# Patient Record
Sex: Female | Born: 1950 | Race: White | Hispanic: No | Marital: Married | Smoking: Current some day smoker
Health system: Southern US, Community
[De-identification: ages and names within clinical notes are randomized; demographics above are authoritative.]

## PROBLEM LIST (undated history)

## (undated) ENCOUNTER — Encounter: Attending: Gastroenterology | Primary: Gastroenterology

## (undated) ENCOUNTER — Telehealth: Attending: Gastroenterology | Primary: Gastroenterology

## (undated) ENCOUNTER — Encounter: Payer: MEDICARE | Attending: Gastroenterology | Primary: Gastroenterology

## (undated) ENCOUNTER — Ambulatory Visit

## (undated) ENCOUNTER — Institutional Professional Consult (permissible substitution): Payer: MEDICARE | Attending: Gastroenterology | Primary: Gastroenterology

## (undated) ENCOUNTER — Telehealth

## (undated) ENCOUNTER — Encounter

## (undated) ENCOUNTER — Ambulatory Visit: Payer: MEDICARE | Attending: Gastroenterology | Primary: Gastroenterology

## (undated) DIAGNOSIS — K5090 Crohn's disease, unspecified, without complications: Secondary | ICD-10-CM

## (undated) DIAGNOSIS — I10 Essential (primary) hypertension: Secondary | ICD-10-CM

## (undated) DIAGNOSIS — E739 Lactose intolerance, unspecified: Secondary | ICD-10-CM

## (undated) DIAGNOSIS — Z87898 Personal history of other specified conditions: Secondary | ICD-10-CM

## (undated) DIAGNOSIS — E039 Hypothyroidism, unspecified: Secondary | ICD-10-CM

## (undated) DIAGNOSIS — J45909 Unspecified asthma, uncomplicated: Secondary | ICD-10-CM

## (undated) HISTORY — PX: TRIGGER FINGER RELEASE: SHX641

## (undated) HISTORY — PX: COLONOSCOPY: SHX5424

## (undated) HISTORY — PX: BUNIONECTOMY: SHX129

## (undated) HISTORY — PX: COLON SURGERY: SHX602

---
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
## 2004-04-19 ENCOUNTER — Ambulatory Visit: Payer: Self-pay | Admitting: Gastroenterology

## 2004-04-27 ENCOUNTER — Ambulatory Visit: Payer: Self-pay | Admitting: Gastroenterology

## 2005-02-07 ENCOUNTER — Ambulatory Visit: Payer: Self-pay | Admitting: Internal Medicine

## 2006-01-03 ENCOUNTER — Ambulatory Visit: Payer: Self-pay | Admitting: Internal Medicine

## 2007-10-16 ENCOUNTER — Ambulatory Visit: Payer: Self-pay | Admitting: Internal Medicine

## 2008-12-02 ENCOUNTER — Ambulatory Visit: Payer: Self-pay | Admitting: Internal Medicine

## 2010-04-27 ENCOUNTER — Ambulatory Visit: Payer: Self-pay | Admitting: Internal Medicine

## 2012-01-03 ENCOUNTER — Ambulatory Visit: Payer: Self-pay | Admitting: Family Medicine

## 2013-04-02 ENCOUNTER — Ambulatory Visit: Payer: Self-pay | Admitting: Family Medicine

## 2014-05-08 ENCOUNTER — Ambulatory Visit: Payer: Self-pay | Admitting: Podiatry

## 2014-06-02 ENCOUNTER — Ambulatory Visit: Payer: Self-pay | Admitting: Family Medicine

## 2015-06-22 ENCOUNTER — Other Ambulatory Visit: Payer: Self-pay | Admitting: Family Medicine

## 2015-06-22 DIAGNOSIS — Z1231 Encounter for screening mammogram for malignant neoplasm of breast: Secondary | ICD-10-CM

## 2015-06-30 ENCOUNTER — Ambulatory Visit
Admission: RE | Admit: 2015-06-30 | Discharge: 2015-06-30 | Disposition: A | Discharge status: Home / Self Care | Payer: Medicare HMO | Source: Ambulatory Visit | Attending: Family Medicine | Admitting: Family Medicine

## 2015-06-30 ENCOUNTER — Other Ambulatory Visit: Payer: Self-pay | Admitting: Family Medicine

## 2015-06-30 ENCOUNTER — Ambulatory Visit: Payer: Self-pay

## 2015-06-30 DIAGNOSIS — Z1231 Encounter for screening mammogram for malignant neoplasm of breast: Secondary | ICD-10-CM | POA: Diagnosis present

## 2015-07-01 ENCOUNTER — Other Ambulatory Visit: Payer: Self-pay | Admitting: Family Medicine

## 2015-07-01 DIAGNOSIS — R928 Other abnormal and inconclusive findings on diagnostic imaging of breast: Secondary | ICD-10-CM

## 2015-07-07 ENCOUNTER — Ambulatory Visit
Admission: RE | Admit: 2015-07-07 | Discharge: 2015-07-07 | Disposition: A | Discharge status: Home / Self Care | Payer: Medicare HMO | Source: Ambulatory Visit | Attending: Family Medicine | Admitting: Family Medicine

## 2015-07-07 DIAGNOSIS — R928 Other abnormal and inconclusive findings on diagnostic imaging of breast: Secondary | ICD-10-CM

## 2016-08-15 ENCOUNTER — Other Ambulatory Visit: Payer: Self-pay | Admitting: Family Medicine

## 2016-08-15 DIAGNOSIS — Z1231 Encounter for screening mammogram for malignant neoplasm of breast: Secondary | ICD-10-CM

## 2016-09-12 ENCOUNTER — Ambulatory Visit
Admission: RE | Admit: 2016-09-12 | Discharge: 2016-09-12 | Disposition: A | Discharge status: Home / Self Care | Payer: Medicare HMO | Source: Ambulatory Visit | Attending: Family Medicine | Admitting: Family Medicine

## 2016-09-12 DIAGNOSIS — Z1231 Encounter for screening mammogram for malignant neoplasm of breast: Secondary | ICD-10-CM | POA: Diagnosis present

## 2016-11-23 MED ORDER — ADALIMUMAB 40 MG/0.8 ML SUBCUTANEOUS PEN KIT
SUBCUTANEOUS | 11 refills | 0 days
Start: 2016-11-23 — End: 2017-08-03

## 2017-01-12 ENCOUNTER — Ambulatory Visit
Admission: RE | Admit: 2017-01-12 | Discharge: 2017-01-12 | Disposition: A | Discharge status: Home / Self Care | Payer: MEDICARE | Attending: Gastroenterology | Admitting: Gastroenterology

## 2017-01-12 DIAGNOSIS — K50018 Crohn's disease of small intestine with other complication: Principal | ICD-10-CM

## 2017-01-12 DIAGNOSIS — D899 Disorder involving the immune mechanism, unspecified: Secondary | ICD-10-CM

## 2017-01-12 MED ORDER — DIPHENOXYLATE-ATROPINE 2.5 MG-0.025 MG TABLET
ORAL_TABLET | Freq: Four times a day (QID) | ORAL | 6 refills | 0 days | Status: CP | PRN
Start: 2017-01-12 — End: 2017-02-11

## 2017-01-31 ENCOUNTER — Ambulatory Visit
Admission: RE | Admit: 2017-01-31 | Discharge: 2017-01-31 | Disposition: A | Discharge status: Home / Self Care | Payer: MEDICARE

## 2017-01-31 DIAGNOSIS — D899 Disorder involving the immune mechanism, unspecified: Secondary | ICD-10-CM

## 2017-01-31 DIAGNOSIS — K50018 Crohn's disease of small intestine with other complication: Principal | ICD-10-CM

## 2017-02-01 MED ORDER — USTEKINUMAB 90 MG/ML SUBCUTANEOUS SYRINGE
SUBCUTANEOUS | 5 refills | 0.00000 days | Status: CP
Start: 2017-02-01 — End: 2017-03-20

## 2017-02-01 MED ORDER — USTEKINUMAB 90 MG/ML SUBCUTANEOUS SYRINGE: mL | 5 refills | 0 days

## 2017-02-03 NOTE — Unmapped (Signed)
Patient will have 200$ copay if stelara given under medical benefit. She will have over 2000$ cost if SQ doses are obtained through pharmacy benefit.  She will apply to [facility] and [facility] but will likely receive denial.  Will need to discuss with Dr. Gwenith Spitz, financial counselor and patient.

## 2017-02-06 NOTE — Unmapped (Signed)
LM for patient to discuss timing of follow up visit in the next 1-2 weeks post results of CT scan and likely need to adjust current therapy.

## 2017-02-08 NOTE — Unmapped (Signed)
CT scan results reviewed with patient showing disease activity despite weekly humira. She is not currently approved for [facility] charity care, needs to apply.  May be able to apply to grant programs.  Will need to discuss cost with patient in more detail along with medication options.  Appt planned for 12/10 at 1pm with Dr. Gwenith Spitz.  Patient verbalized understanding and in agreement with plan.

## 2017-02-08 NOTE — Unmapped (Signed)
Patient will come 12/10 at 1pm to see Dr. Gwenith Spitz to discuss CT results and medication options from here.  Reviewed that most of our medications that we will be discussing will be about 200$ per dose.  Suggest applying for charity care, applying for grant money from foundations, setting up payment plan.  Will discuss in more detail with patient when she is here next week for follow up.  Patient verbalized understanding and in agreement with plan.

## 2017-03-20 ENCOUNTER — Encounter: Admit: 2017-03-20 | Discharge: 2017-03-21 | Payer: MEDICARE | Attending: Gastroenterology | Primary: Gastroenterology

## 2017-03-20 DIAGNOSIS — Z79899 Other long term (current) drug therapy: Secondary | ICD-10-CM

## 2017-03-20 DIAGNOSIS — K50018 Crohn's disease of small intestine with other complication: Principal | ICD-10-CM

## 2017-03-20 MED ORDER — BUDESONIDE DR - ER 3 MG CAPSULE,DELAYED,EXTENDED RELEASE
ORAL_CAPSULE | Freq: Every morning | ORAL | 0 refills | 0 days | Status: CP
Start: 2017-03-20 — End: 2017-04-19

## 2017-03-20 MED ORDER — PREDNISONE 10 MG TABLET
ORAL_TABLET | 0 refills | 0 days | Status: CP
Start: 2017-03-20 — End: 2017-10-12

## 2017-03-20 NOTE — Unmapped (Signed)
Gastroenterology Return Visit Note      HISTORY OF PRESENT ILLNESS: This is a 67 y.o. year old female with a history of Crohn's disease as outlined below. She continues to lose weight ( another pound since I last saw her). She has about 3-4 loose bowel movements daily, no blood mixed in the bowel movements. She takes Imodium to reduce frequency. However she can do it only for 1 day on the next day she has otherwise headaches if she would take it again. No abdominal pain. No fever, no extraintestinal manifestations of inflammatory bowel disease. Her recent CT shows several short inflammatory small bowel segments. Her adalimumab level was high on weekly dosing, however, she did not rebnew her free Humira and is currently 2 weeks out of the last injection. Application to charity care pending.      PAST MEDICAL HISTORY:    Past Medical History:   Diagnosis Date   ??? Crohn's disease (CMS-HCC)    ??? Disease of thyroid gland    ??? Hypertension        PAST SURGICAL HISTORY:    Past Surgical History:   Procedure Laterality Date   ??? ABDOMINAL SURGERY      colon resection   ??? BUNIONECTOMY Left     with pins   ??? COLON SURGERY      resection   ??? FOOT SURGERY Left     bunionectomy; pins placed   ??? PR COLONOSCOPY FLX DX W/COLLJ SPEC WHEN PFRMD N/A 08/03/2012    Procedure: COLONOSCOPY, FLEXIBLE, PROXIMAL TO SPLENIC FLEXURE; DIAGNOSTIC, W/WO COLLECTION SPECIMEN BY BRUSH OR WASH;  Surgeon: Trula Slade, MD;  Location: [facility];  Service: Gastroenterology   ??? PR COLONOSCOPY FLX DX W/COLLJ SPEC WHEN PFRMD N/A 10/24/2014    Procedure: COLONOSCOPY, FLEXIBLE, PROXIMAL TO SPLENIC FLEXURE; DIAGNOSTIC, W/WO COLLECTION SPECIMEN BY BRUSH OR WASH;  Surgeon: Trula Slade, MD;  Location: [facility];  Service: Gastroenterology   ??? PR COLONOSCOPY FLX DX W/COLLJ SPEC WHEN PFRMD N/A 04/08/2016    Procedure: COLONOSCOPY, FLEXIBLE, PROXIMAL TO SPLENIC FLEXURE; DIAGNOSTIC, W/WO COLLECTION SPECIMEN BY BRUSH OR WASH; Surgeon: Modena Nunnery, MD;  Location: [facility];  Service: Gastroenterology         CURRENT MEDICATIONS:      Current Outpatient Prescriptions:   ???  biotin 1 mg tablet, Take 2,000 mcg by mouth daily. , Disp: , Rfl:   ???  calcium-vitamin D (CALCIUM-VITAMIN D) 500 mg(1,250mg ) -200 unit per tablet, Take 1 tablet by mouth daily. , Disp: , Rfl:   ???  levothyroxine (SYNTHROID) 25 MCG tablet, Take 25 mcg by mouth daily at 0600. , Disp: , Rfl:   ???  metoprolol succinate (TOPROL-XL) 50 MG 24 hr tablet, Take 50 mg by mouth daily., Disp: , Rfl:   ???  multivitamin capsule, Take 1 capsule by mouth daily., Disp: , Rfl:   ???  multivitamin,tx-minerals Tab, Take 1 tablet by mouth daily with breakfast. , Disp: , Rfl:   ???  adalimumab (HUMIRA) 40 mg/0.8 mL subcutaneous pen kit, Inject 0.8 mL (40 mg total) under the skin once a week. (Patient not taking: Reported on 03/20/2017), Disp: 4 each, Rfl: 11  ???  budesonide (ENTOCORT EC) 3 mg 24 hr capsule, Take 3 capsules (9 mg total) by mouth every morning., Disp: 90 capsule, Rfl: 0  ???  cyanocobalamin (B-12 DOTS) 500 MCG tablet, Take by mouth., Disp: , Rfl:   ???  predniSONE (DELTASONE) 10  MG tablet, Prednisone 40 mg for 2 weeks, then  30 mg, 20 mg, 15 mg, 10 mg, 5 mg each for 1 week, Disp: 180 tablet, Rfl: 0       VITAL SIGNS:  BP Readings from Last 4 Encounters:   03/20/17 140/60   01/12/17 145/65   04/08/16 124/70   02/01/16 130/73      Wt Readings from Last 4 Encounters:   03/20/17 50.4 kg (111 lb 1.8 oz)   01/12/17 50.8 kg (112 lb)   04/08/16 51.7 kg (114 lb)   02/01/16 52.8 kg (116 lb 6.4 oz)        BMI: Estimated body mass index is 18.49 kg/m?? as calculated from the following:    Height as of 04/08/16: 165.1 cm (5' 5).    Weight as of this encounter: 50.4 kg (111 lb 1.8 oz).    BSA: Estimated body surface area is 1.52 meters squared as calculated from the following:    Height as of 04/08/16: 165.1 cm (5' 5).    Weight as of this encounter: 50.4 kg (111 lb 1.8 oz). PHYSICAL EXAM:    Constitutional:   Alert, oriented x 3, no acute distress, well nourished, and well hydrated.   Mental Status:   Thought organized, appropriate affect, pleasantly interactive, not anxious appearing.   HEENT:   PEERL, conjunctiva clear, anicteric, oropharynx clear, neck supple, no LAD.   Respiratory: Clear to auscultation, unlabored breathing.     Cardiac: Euvolemic, regular rate and rhythm, normal S1 and S2, no murmur.     Abdomen: Soft, normal bowel sounds, non-distended, non-tender, no organomegaly or masses.     Perianal/Rectal Exam Not performed.     Extremities:   No edema, well perfused.   Musculoskeletal: No joint swelling or tenderness noted, no deformities.     Skin: No rashes, jaundice or skin lesions noted.     Neuro: No focal deficits.          ASSESSMENT:    1. Crohns disease with Hx of small bowel resection due to stricturing and fistulizing luminal disease in 2008 and recurrence at anastomosis in 02/2010. Start of Humira in 04/2010       Course of Disease:  First diagnosis of Crohn's disease in approximately the late 1980s. At that time, she had cramping abdominal pain and diarrhea. She was started on a steroid taper and maintained on sulfasalazine. Remission until approximately 2004. In 2004 start of abdominal bloating, epigastric pain, and intermittent loose stools. 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 Start of ciprofloxacin therapy approximately in 2005 as well as Entocort and of Pentasa 4 g daily. Approximately in the beginning of 2007 decrease of entocort to 6 mg per day, however, still abdominal pain, bloating. 05/08/06 Ileocecal resection with ileostomy and repair of ileal fistula as well as takedown and repair of ileal sigmoid fistula with sigmoid colectomy (About 18 inches of small bowel and 4 inches of cecum/colon were resected). 06/22/06,ileostomy takedown with ileal colonic anastomosis. 11/2006 remission. Intolerance of 6-MP therapy (joint pain, hair loss, nausea).12/08Start of sulfasalazine due to joint pain. 5/09-10/2009 remission. 04/2010, start of Humira. 09/2010 -02/2012 clinical remission. 03/2014 mild symptoms in the settings of missing a dose of Humira 07/2014 remission but another interruption of Humira therapy for 4 weeks due to foot surgery 01/2015 increase to weekly Humira therapy in the setting of I3 recurrence 01/2017 continues weight loss on weekly Humira therapy 02/2017 CT showing several inflammatory  small bowel segments 03/2017 start of prednisone taper       Montreal Classification: [id number]     Crohn's disease: The optimal approach would be probably in the moment budesonide and adalimumab, but patient can most likely not affortd budesonide , thus we plan a steroid taper. Long term probably a switch to another biologic is warranted due to intolerance of 6-MP in the past. Optimal would be ustekinumab, but if patient is not approved by charity care show would have approx. $100 copayment's/ month which she cant afford. MTX would be another possibility, but I am not sure if the patient will tolerate the potential side effects.    We will continue with imodium 1 tabl q 2 days (due to headache), lomotil was not approved.     Health maintenance: Prevnar 2015 and 2016. Pneumovax 2016. Flue shot 2018    PLAN:  Patient Instructions   Prednisone 40 mg for 2 weeks, then  30 mg, 20 mg, 15 mg, 10 mg, 5 mg each for 1 week       Ask what 1 month of budesonide (entocort) would cost you.    Renewal of Humira prescription q 2 weeks.    Best solution would be to start Stelara with charity care. If not possible second best choice would be maintenance therapy with budesonide 6 mg daily and Humira q 2 weeks.             DIAGNOSTIC STUDIES:  I have reviewed all pertinent diagnostic studies, including:    Colonoscopy 04/2016: There was evidence of a prior end-to-side ileo-colonic anastomosis in        the ascending colon. This was patent and was characterized by edema,        erythema and ulceration. The anastomosis was traversed. Rutgeerts i2-i3.       The terminal ileum appeared normal. should perform another                        colonoscopy for assessment of inflammatuion in 12-18                        months.  Laboratory results    All Labs    Lab Results   Component Value Date    AADAL <25 10/27/2014     Lab Results   Component Value Date    ADALIMUMAB 31.7 01/12/2017    ADALIMUMAB 5.1 10/27/2014       No visits with results within 1 Week(s) from this visit.   Latest known visit with results is:   Office Visit on 01/12/2017   Component Date Value Ref Range Status   ??? Adalimumab 01/12/2017 31.7  mcg/mL Final   ??? Alkaline Phosphatase 01/12/2017 99  38 - 126 U/L Final   ??? ALT 01/12/2017 27  15 - 48 U/L Final   ??? AST 01/12/2017 26  14 - 38 U/L Final   ??? Sodium 01/12/2017 141  135 - 145 mmol/L Final   ??? Potassium 01/12/2017 4.2  3.5 - 5.0 mmol/L Final   ??? Chloride 01/12/2017 104  98 - 107 mmol/L Final   ??? CO2 01/12/2017 30.0  22.0 - 30.0 mmol/L Final   ??? BUN 01/12/2017 12  7 - 21 mg/dL Final   ??? Creatinine 01/12/2017 0.67  0.60 - 1.00 mg/dL Final   ??? BUN/Creatinine Ratio 01/12/2017 18   Final   ??? EGFR MDRD Non Af Amer 01/12/2017 >=60  >=  60 mL/min/1.47m2 Final   ??? EGFR MDRD Af Amer 01/12/2017 >=60  >=60 mL/min/1.55m2 Final   ??? Anion Gap 01/12/2017 7* 9 - 15 mmol/L Final   ??? Glucose 01/12/2017 80  65 - 179 mg/dL Final   ??? Calcium 16/12/9602 10.1  8.5 - 10.2 mg/dL Final   ??? Vitamin D Total (25OH) 01/12/2017 44.9  20.0 - 80.0 ng/mL Final   ??? Ferritin 01/12/2017 69.9  3.0 - 151.0 ng/mL Final   ??? Vitamin B-12 01/12/2017 246  193 - 900 pg/ml Final   ??? Folate 01/12/2017 >20.0* 2.7 - 20.0 ng/mL Final   ??? PT 01/12/2017 11.3  10.2 - 12.8 sec Final   ??? INR 01/12/2017 0.99   Final   ??? WBC 01/12/2017 9.8  4.5 - 11.0 10*9/L Final   ??? RBC 01/12/2017 4.14  4.00 - 5.20 10*12/L Final   ??? HGB 01/12/2017 12.8  12.0 - 16.0 g/dL Final   ??? HCT 54/11/8117 39.1  36.0 - 46.0 % Final   ??? MCV 01/12/2017 94.3  80.0 - 100.0 fL Final   ??? MCH 01/12/2017 30.8  26.0 - 34.0 pg Final   ??? MCHC 01/12/2017 32.7  31.0 - 37.0 g/dL Final   ??? RDW 14/78/2956 13.3  12.0 - 15.0 % Final   ??? MPV 01/12/2017 7.8  7.0 - 10.0 fL Final   ??? Platelet 01/12/2017 251  150 - 440 10*9/L Final   ??? Absolute Neutrophils 01/12/2017 5.8  2.0 - 7.5 10*9/L Final   ??? Absolute Lymphocytes 01/12/2017 2.8  1.5 - 5.0 10*9/L Final   ??? Absolute Monocytes 01/12/2017 0.7  0.2 - 0.8 10*9/L Final   ??? Absolute Eosinophils 01/12/2017 0.2  0.0 - 0.4 10*9/L Final   ??? Absolute Basophils 01/12/2017 0.1  0.0 - 0.1 10*9/L Final   ??? Large Unstained Cells 01/12/2017 2  0 - 4 % Final

## 2017-03-20 NOTE — Unmapped (Signed)
Prednisone 40 mg for 2 weeks, then  30 mg, 20 mg, 15 mg, 10 mg, 5 mg each for 1 week       Ask what 1 month of budesonide (entocort) would cost you.    Renewal of Humira prescription q 2 weeks.    Best solution would be to start Stelara with charity care. If not possible second best choice would be maintenance therapy with budesonide 6 mg daily and Humira q 2 weeks.

## 2017-04-04 NOTE — Unmapped (Signed)
Application for humira PAP renewal sent 03/20/2017, patient given her portion to send separately.  Called to check on status today and was notified that patient was approved as of 04/03/2017. Inclement weather in [place] has limited ship dates and patient instructed to call to order asap to avoid further delay.  She will being with loading dose at 160mg  day 1, then 80mg  day 15 followed by 40mg  every 2weeks maintenance.  She will contact IBD RN as soon as she hears back from [facility] charity care in terms of approval for that program at which time plan will be to switch to stelara under medical benefit.  Patient verbalized understanding and in agreement with plan.

## 2017-07-06 ENCOUNTER — Encounter: Admit: 2017-07-06 | Discharge: 2017-07-07 | Payer: MEDICARE | Attending: Gastroenterology | Primary: Gastroenterology

## 2017-07-06 DIAGNOSIS — Z79899 Other long term (current) drug therapy: Secondary | ICD-10-CM

## 2017-07-06 DIAGNOSIS — K50018 Crohn's disease of small intestine with other complication: Principal | ICD-10-CM

## 2017-07-06 LAB — FERRITIN: Ferritin:MCnc:Pt:Ser/Plas:Qn:: 39.1

## 2017-07-06 LAB — BASIC METABOLIC PANEL
BLOOD UREA NITROGEN: 14 mg/dL (ref 7–21)
BUN / CREAT RATIO: 23
CALCIUM: 9.7 mg/dL (ref 8.5–10.2)
CHLORIDE: 111 mmol/L — ABNORMAL HIGH (ref 98–107)
CO2: 28 mmol/L (ref 22.0–30.0)
CREATININE: 0.6 mg/dL (ref 0.60–1.00)
EGFR MDRD AF AMER: >=60 mL/min/{1.73_m2} (ref >=60)
EGFR MDRD NON AF AMER: >=60 mL/min/{1.73_m2} (ref >=60)
GLUCOSE RANDOM: 78 mg/dL (ref 65–179)
POTASSIUM: 4.6 mmol/L (ref 3.5–5.0)

## 2017-07-06 LAB — CBC W/ AUTO DIFF
BASOPHILS ABSOLUTE COUNT: 0.1 10*9/L (ref 0.0–0.1)
BASOPHILS RELATIVE PERCENT: 0.7 %
EOSINOPHILS ABSOLUTE COUNT: 0.2 10*9/L (ref 0.0–0.4)
EOSINOPHILS RELATIVE PERCENT: 2.7 %
HEMATOCRIT: 37.4 % (ref 36.0–46.0)
LARGE UNSTAINED CELLS: 3 % (ref 0–4)
LYMPHOCYTES ABSOLUTE COUNT: 2.5 10*9/L (ref 1.5–5.0)
MEAN CORPUSCULAR HEMOGLOBIN CONC: 31.9 g/dL (ref 31.0–37.0)
MEAN CORPUSCULAR HEMOGLOBIN: 30.9 pg (ref 26.0–34.0)
MEAN CORPUSCULAR VOLUME: 97.1 fL (ref 80.0–100.0)
MEAN PLATELET VOLUME: 8.4 fL (ref 7.0–10.0)
MONOCYTES RELATIVE PERCENT: 7.8 %
NEUTROPHILS ABSOLUTE COUNT: 4.2 10*9/L (ref 2.0–7.5)
NEUTROPHILS RELATIVE PERCENT: 53.4 %
PLATELET COUNT: 223 10*9/L (ref 150–440)
RED BLOOD CELL COUNT: 3.85 10*12/L — ABNORMAL LOW (ref 4.00–5.20)
RED CELL DISTRIBUTION WIDTH: 13.5 % (ref 12.0–15.0)
WBC ADJUSTED: 7.8 10*9/L (ref 4.5–11.0)

## 2017-07-06 LAB — CO2: Carbon dioxide:SCnc:Pt:Ser/Plas:Qn:: 28

## 2017-07-06 LAB — ALKALINE PHOSPHATASE: Alkaline phosphatase:CCnc:Pt:Ser/Plas:Qn:: 81

## 2017-07-06 LAB — C-REACTIVE PROTEIN: C reactive protein:MCnc:Pt:Ser/Plas:Qn:: <5

## 2017-07-06 LAB — AST (SGOT): Aspartate aminotransferase:CCnc:Pt:Ser/Plas:Qn:: 25

## 2017-07-06 LAB — IRON PANEL
IRON SATURATION (CALC): 27 % (ref 15–50)
TOTAL IRON BINDING CAPACITY (CALC): 379.8 mg/dL (ref 252.0–479.0)

## 2017-07-06 LAB — WBC ADJUSTED: Lab: 7.8

## 2017-07-06 LAB — ALT (SGPT): Alanine aminotransferase:CCnc:Pt:Ser/Plas:Qn:: 16

## 2017-07-06 LAB — IRON SATURATION (CALC): Iron saturation:MFr:Pt:Ser/Plas:Qn:: 27

## 2017-07-06 NOTE — Unmapped (Addendum)
Think about the clinical trial, let me know if you have any questions. We can call you and explain details. If you begin to feel worse we really should consider this option.    Labs today    Continue Humira weekly.

## 2017-07-06 NOTE — Unmapped (Signed)
Gastroenterology Return Visit Note      HISTORY OF PRESENT ILLNESS: This is a 68 y.o. year old female with a history of Crohn's disease as outlined below. She gained her weight back after steroid steroid tapertaper in January. When on steroids she did very well, off steroids her problem partially returned with increase of bowel frequency up to 3-4 bowel movements daily and some intermittent abdominal pain. No fever.    PAST MEDICAL HISTORY:    Past Medical History:   Diagnosis Date   ??? Crohn's disease (CMS-HCC)    ??? Disease of thyroid gland    ??? Hypertension        PAST SURGICAL HISTORY:    Past Surgical History:   Procedure Laterality Date   ??? ABDOMINAL SURGERY      colon resection   ??? BUNIONECTOMY Left     with pins   ??? COLON SURGERY      resection   ??? FOOT SURGERY Left     bunionectomy; pins placed   ??? PR COLONOSCOPY FLX DX W/COLLJ SPEC WHEN PFRMD N/A 08/03/2012    Procedure: COLONOSCOPY, FLEXIBLE, PROXIMAL TO SPLENIC FLEXURE; DIAGNOSTIC, W/WO COLLECTION SPECIMEN BY BRUSH OR WASH;  Surgeon: Trula Slade, MD;  Location: [facility];  Service: Gastroenterology   ??? PR COLONOSCOPY FLX DX W/COLLJ SPEC WHEN PFRMD N/A 10/24/2014    Procedure: COLONOSCOPY, FLEXIBLE, PROXIMAL TO SPLENIC FLEXURE; DIAGNOSTIC, W/WO COLLECTION SPECIMEN BY BRUSH OR WASH;  Surgeon: Trula Slade, MD;  Location: [facility];  Service: Gastroenterology   ??? PR COLONOSCOPY FLX DX W/COLLJ SPEC WHEN PFRMD N/A 04/08/2016    Procedure: COLONOSCOPY, FLEXIBLE, PROXIMAL TO SPLENIC FLEXURE; DIAGNOSTIC, W/WO COLLECTION SPECIMEN BY BRUSH OR WASH;  Surgeon: Modena Nunnery, MD;  Location: [facility];  Service: Gastroenterology         CURRENT MEDICATIONS:      Current Outpatient Medications:   ???  adalimumab (HUMIRA) 40 mg/0.8 mL subcutaneous pen kit, Inject 0.8 mL (40 mg total) under the skin once a week., Disp: 4 each, Rfl: 11  ???  biotin 1 mg tablet, Take 1,000 mcg by mouth daily. , Disp: , Rfl: ???  calcium-vitamin D (CALCIUM-VITAMIN D) 500 mg(1,250mg ) -200 unit per tablet, Take 1 tablet by mouth daily. , Disp: , Rfl:   ???  levothyroxine (SYNTHROID) 25 MCG tablet, Take 25 mcg by mouth daily at 0600. , Disp: , Rfl:   ???  metoprolol succinate (TOPROL-XL) 50 MG 24 hr tablet, Take 50 mg by mouth daily., Disp: , Rfl:   ???  multivitamin,tx-minerals Tab, Take 1 tablet by mouth daily with breakfast. , Disp: , Rfl:   ???  cyanocobalamin (B-12 DOTS) 500 MCG tablet, Take by mouth., Disp: , Rfl:   ???  multivitamin capsule, Take 1 capsule by mouth daily., Disp: , Rfl:   ???  predniSONE (DELTASONE) 10 MG tablet, Prednisone 40 mg for 2 weeks, then  30 mg, 20 mg, 15 mg, 10 mg, 5 mg each for 1 week (Patient not taking: Reported on 07/06/2017), Disp: 180 tablet, Rfl: 0       VITAL SIGNS:  BP Readings from Last 4 Encounters:   07/06/17 145/66   03/20/17 140/60   01/12/17 145/65   04/08/16 124/70      Wt Readings from Last 4 Encounters:   07/06/17 52.4 kg (115 lb 8 oz)   03/20/17 50.4 kg (111 lb 1.8 oz)   01/12/17 50.8 kg (112 lb)  04/08/16 51.7 kg (114 lb)        BMI: Estimated body mass index is 19.22 kg/m?? as calculated from the following:    Height as of 04/08/16: 165.1 cm (5' 5).    Weight as of this encounter: 52.4 kg (115 lb 8 oz).    BSA: Estimated body surface area is 1.55 meters squared as calculated from the following:    Height as of 04/08/16: 165.1 cm (5' 5).    Weight as of this encounter: 52.4 kg (115 lb 8 oz).      PHYSICAL EXAM:    Constitutional:   Alert, oriented x 3, no acute distress, well nourished, and well hydrated.   Mental Status:   Thought organized, appropriate affect, pleasantly interactive, not anxious appearing.   HEENT:   PEERL, conjunctiva clear, anicteric, oropharynx clear, neck supple, no LAD.   Respiratory: Clear to auscultation, unlabored breathing.     Cardiac: Euvolemic, regular rate and rhythm, normal S1 and S2, no murmur.     Abdomen: Soft, normal bowel sounds, non-distended, non-tender, no organomegaly or masses.     Perianal/Rectal Exam Not performed.     Extremities:   No edema, well perfused.   Musculoskeletal: No joint swelling or tenderness noted, no deformities.     Skin: No rashes, jaundice or skin lesions noted.     Neuro: No focal deficits.          ASSESSMENT:    1. Crohns disease with Hx of small bowel resection due to stricturing and fistulizing luminal disease in 2008 and recurrence at anastomosis in 02/2010. Start of Humira in 04/2010       Course of Disease:  First diagnosis of Crohn's disease in approximately the late 1980s. At that time, she had cramping abdominal pain and diarrhea. She was started on a steroid taper and maintained on sulfasalazine. Remission until approximately 2004. In 2004 start of abdominal bloating, epigastric pain, and intermittent loose stools. 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 Start of ciprofloxacin therapy approximately in 2005 as well as Entocort and of Pentasa 4 g daily. Approximately in the beginning of 2007 decrease of entocort to 6 mg per day, however, still abdominal pain, bloating. 05/08/06 Ileocecal resection with ileostomy and repair of ileal fistula as well as takedown and repair of ileal sigmoid fistula with sigmoid colectomy (About 18 inches of small bowel and 4 inches of cecum/colon were resected). 06/22/06,ileostomy takedown with ileal colonic anastomosis. 11/2006 remission. Intolerance of 6-MP therapy (joint pain, hair loss, nausea).12/08Start of sulfasalazine due to joint pain. 5/09-10/2009 remission. 04/2010, start of Humira. 09/2010 -02/2012 clinical remission. 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  Unfortunately budesonide is cost prohibitive for the patient since it would be the optimal solution in combination with the Humira, which she gets for free from [place].  Currently after doing very well on steroids she is slightly worse with 3-4 bowel movements daily but still feels much better than before the steroid taper.  She was also not approved  for charity care.  Thus we are not able to switch her to ustekinumab.  I explained her the possibility of a clinical study comparing ustekinumab with a new IL- 23 inhibitor guslekumab and gave her the informed consent.  She felt let me know if she is interested and we will assess the participation if she getting worse in the next 2 to 3 months.    She takes currently Imodium Imodium every second or third day but is not able to take more often due to headaches and due to increased intestinal gas when she takes it.  This indicates that there is still inflammatory strictures.    Bone health: Vit D normal range    Labs: Slightly decreased HGB. Normal CRP    Iron deficiency: Normal ferrtin    Health maintenance: Prevnar 2015 and 2016. Pneumovax 2016. Flue shot 2018    PLAN:  Patient Instructions   Think about the clinical trial, let me know if you have any questions. We can call you and explain details. If you begin to feel worse we really should consider this option.    Labs today    Continue Humira weekly.                DIAGNOSTIC STUDIES:  I have reviewed all pertinent diagnostic studies, including:    Colonoscopy 04/2016: There was evidence of a prior end-to-side ileo-colonic anastomosis in        the ascending colon. This was patent and was characterized by edema,        erythema and ulceration. The anastomosis was traversed. Rutgeerts i2-i3.       The terminal ileum appeared normal. should perform another                        colonoscopy for assessment of inflammation in 12-18                        months.  Laboratory results    All Labs    Lab Results   Component Value Date    AADAL <25 10/27/2014     Lab Results   Component Value Date    ADALIMUMAB 31.7 01/12/2017    ADALIMUMAB 5.1 10/27/2014       Office Visit on 07/06/2017   Component Date Value Ref Range Status   ??? Alkaline Phosphatase 07/06/2017 81  38 - 126 U/L Final   ??? ALT 07/06/2017 16  15 - 48 U/L Final   ??? AST 07/06/2017 25  14 - 38 U/L Final   ??? Sodium 07/06/2017 142  135 - 145 mmol/L Final   ??? Potassium 07/06/2017 4.6  3.5 - 5.0 mmol/L Final   ??? Chloride 07/06/2017 111* 98 - 107 mmol/L Final   ??? CO2 07/06/2017 28.0  22.0 - 30.0 mmol/L Final   ??? BUN 07/06/2017 14  7 - 21 mg/dL Final   ??? Creatinine 07/06/2017 0.60  0.60 - 1.00 mg/dL Final   ??? BUN/Creatinine Ratio 07/06/2017 23   Final   ??? EGFR MDRD Non Af Amer 07/06/2017 >=60  >=60 mL/min/1.60m2 Final   ??? EGFR MDRD Af Amer 07/06/2017 >=60  >=60 mL/min/1.90m2 Final   ??? Anion Gap 07/06/2017 3* 9 - 15 mmol/L Final   ??? Glucose 07/06/2017 78  65 - 179 mg/dL Final   ??? Calcium 53/66/4403 9.7  8.5 - 10.2 mg/dL Final   ??? CRP 47/42/5956 <5.0  <10.0 mg/L Final   ???  Ferritin 07/06/2017 39.1  3.0 - 151.0 ng/mL Final   ??? Vitamin D Total (25OH) 07/06/2017 34.6  20.0 - 80.0 ng/mL Final   ??? Iron 07/06/2017 102  35 - 165 ug/dL Final   ??? TIBC 29/56/2130 379.8  252.0 - 479.0 mg/dL Final   ??? Transferrin 07/06/2017 301.4  200.0 - 380.0 mg/dL Final   ??? Iron Saturation (%) 07/06/2017 27  15 - 50 % Final   ??? WBC 07/06/2017 7.8  4.5 - 11.0 10*9/L Final   ??? RBC 07/06/2017 3.85* 4.00 - 5.20 10*12/L Final   ??? HGB 07/06/2017 11.9* 12.0 - 16.0 g/dL Final   ??? HCT 86/57/8469 37.4  36.0 - 46.0 % Final   ??? MCV 07/06/2017 97.1  80.0 - 100.0 fL Final   ??? MCH 07/06/2017 30.9  26.0 - 34.0 pg Final   ??? MCHC 07/06/2017 31.9  31.0 - 37.0 g/dL Final   ??? RDW 62/95/2841 13.5  12.0 - 15.0 % Final   ??? MPV 07/06/2017 8.4  7.0 - 10.0 fL Final ??? Platelet 07/06/2017 223  150 - 440 10*9/L Final   ??? Neutrophils % 07/06/2017 53.4  % Final   ??? Lymphocytes % 07/06/2017 32.4  % Final   ??? Monocytes % 07/06/2017 7.8  % Final   ??? Eosinophils % 07/06/2017 2.7  % Final   ??? Basophils % 07/06/2017 0.7  % Final   ??? Absolute Neutrophils 07/06/2017 4.2  2.0 - 7.5 10*9/L Final   ??? Absolute Lymphocytes 07/06/2017 2.5  1.5 - 5.0 10*9/L Final   ??? Absolute Monocytes 07/06/2017 0.6  0.2 - 0.8 10*9/L Final   ??? Absolute Eosinophils 07/06/2017 0.2  0.0 - 0.4 10*9/L Final   ??? Absolute Basophils 07/06/2017 0.1  0.0 - 0.1 10*9/L Final   ??? Large Unstained Cells 07/06/2017 3  0 - 4 % Final   ??? Macrocytosis 07/06/2017 Slight* Not Present Final   ??? Hypochromasia 07/06/2017 Moderate* Not Present Final

## 2017-07-07 LAB — VITAMIN D, TOTAL (25OH): Lab: 34.6

## 2017-08-03 MED ORDER — ADALIMUMAB 40 MG/0.8 ML SUBCUTANEOUS PEN KIT
SUBCUTANEOUS | 4 refills | 0.00000 days
Start: 2017-08-03 — End: 2017-08-03

## 2017-08-03 MED ORDER — ADALIMUMAB PEN CITRATE FREE 40 MG/0.4 ML
SUBCUTANEOUS | 3 refills | 0.00000 days
Start: 2017-08-03 — End: 2017-12-27

## 2017-08-03 NOTE — Unmapped (Signed)
Refills for humira called to [facility] for 40mg  once weekly dosing, switched to citrate free. Patient updated.

## 2017-09-11 ENCOUNTER — Other Ambulatory Visit: Payer: Self-pay | Admitting: Family Medicine

## 2017-09-11 DIAGNOSIS — Z1231 Encounter for screening mammogram for malignant neoplasm of breast: Secondary | ICD-10-CM

## 2017-10-02 ENCOUNTER — Ambulatory Visit
Admission: RE | Admit: 2017-10-02 | Discharge: 2017-10-02 | Disposition: A | Discharge status: Home / Self Care | Payer: Medicare HMO | Source: Ambulatory Visit | Attending: Family Medicine | Admitting: Family Medicine

## 2017-10-02 DIAGNOSIS — Z1231 Encounter for screening mammogram for malignant neoplasm of breast: Secondary | ICD-10-CM | POA: Diagnosis not present

## 2017-10-12 ENCOUNTER — Encounter: Admit: 2017-10-12 | Discharge: 2017-10-13 | Payer: MEDICARE | Attending: Gastroenterology | Primary: Gastroenterology

## 2017-10-12 DIAGNOSIS — Z79899 Other long term (current) drug therapy: Secondary | ICD-10-CM

## 2017-10-12 DIAGNOSIS — E611 Iron deficiency: Secondary | ICD-10-CM

## 2017-10-12 DIAGNOSIS — E538 Deficiency of other specified B group vitamins: Secondary | ICD-10-CM

## 2017-10-12 DIAGNOSIS — Z23 Encounter for immunization: Secondary | ICD-10-CM

## 2017-10-12 DIAGNOSIS — K50018 Crohn's disease of small intestine with other complication: Principal | ICD-10-CM

## 2017-10-12 LAB — CBC W/ AUTO DIFF
BASOPHILS ABSOLUTE COUNT: 0 10*9/L (ref 0.0–0.1)
BASOPHILS RELATIVE PERCENT: 0.3 %
EOSINOPHILS ABSOLUTE COUNT: 0.2 10*9/L (ref 0.0–0.4)
EOSINOPHILS RELATIVE PERCENT: 2.5 %
HEMATOCRIT: 39.9 % (ref 36.0–46.0)
HEMOGLOBIN: 12.9 g/dL (ref 12.0–16.0)
LYMPHOCYTES ABSOLUTE COUNT: 2.5 10*9/L (ref 1.5–5.0)
LYMPHOCYTES RELATIVE PERCENT: 28.1 %
MEAN CORPUSCULAR HEMOGLOBIN CONC: 32.4 g/dL (ref 31.0–37.0)
MEAN CORPUSCULAR HEMOGLOBIN: 30.1 pg (ref 26.0–34.0)
MEAN CORPUSCULAR VOLUME: 93 fL (ref 80.0–100.0)
MEAN PLATELET VOLUME: 8 fL (ref 7.0–10.0)
MONOCYTES ABSOLUTE COUNT: 0.8 10*9/L (ref 0.2–0.8)
NEUTROPHILS ABSOLUTE COUNT: 5.1 10*9/L (ref 2.0–7.5)
NEUTROPHILS RELATIVE PERCENT: 56.8 %
PLATELET COUNT: 250 10*9/L (ref 150–440)
RED BLOOD CELL COUNT: 4.29 10*12/L (ref 4.00–5.20)
RED CELL DISTRIBUTION WIDTH: 13.3 % (ref 12.0–15.0)

## 2017-10-12 LAB — ALKALINE PHOSPHATASE: Alkaline phosphatase:CCnc:Pt:Ser/Plas:Qn:: 92

## 2017-10-12 LAB — BASIC METABOLIC PANEL
ANION GAP: 3 mmol/L — ABNORMAL LOW (ref 9–15)
BUN / CREAT RATIO: 19
CALCIUM: 9.9 mg/dL (ref 8.5–10.2)
CHLORIDE: 109 mmol/L — ABNORMAL HIGH (ref 98–107)
CO2: 29 mmol/L (ref 22.0–30.0)
CREATININE: 0.67 mg/dL (ref 0.60–1.00)
EGFR CKD-EPI AA FEMALE: >90 mL/min/{1.73_m2} (ref >=60)
EGFR CKD-EPI NON-AA FEMALE: >90 mL/min/{1.73_m2} (ref >=60)
GLUCOSE RANDOM: 75 mg/dL (ref 65–99)
POTASSIUM: 4 mmol/L (ref 3.5–5.0)
SODIUM: 141 mmol/L (ref 135–145)

## 2017-10-12 LAB — SODIUM: Sodium:SCnc:Pt:Ser/Plas:Qn:: 141

## 2017-10-12 LAB — AST (SGOT): Aspartate aminotransferase:CCnc:Pt:Ser/Plas:Qn:: 25

## 2017-10-12 LAB — HEMATOCRIT: Lab: 39.9

## 2017-10-12 LAB — FERRITIN: Ferritin:MCnc:Pt:Ser/Plas:Qn:: 53.4

## 2017-10-12 LAB — C-REACTIVE PROTEIN: C reactive protein:MCnc:Pt:Ser/Plas:Qn:: <5

## 2017-10-12 LAB — ALT (SGPT): Alanine aminotransferase:CCnc:Pt:Ser/Plas:Qn:: 22

## 2017-10-12 MED ORDER — PREDNISONE 10 MG TABLET
ORAL_TABLET | 0 refills | 0 days | Status: CP
Start: 2017-10-12 — End: 2017-11-25

## 2017-10-12 NOTE — Unmapped (Signed)
Stop Humira. You can bring [place] the remaining Humira pens in a cool bag next time, this would be great.    Prednisone 40 mg for 2 weeks, then  30 mg, 20 mg, 15 mg, 10 mg, 5 mg each for 1 week       We will call you in 4 weeks and see how you are doing and see how we time the study screening visit.     Shingles vaccine today (Shingrix) and in 2-6 months    We will call you if any labs are significantly abnormal.  Please contact us by phone or email or using my chart if you have any questions.    For more information about inflammatory bowel diseases please also go to Crohn's and Colitis Foundation ([email or web address]  Professor of Medicine  Department of Medicine  [place] [facility]    Nurse contact:  Alesia Banda   Please use my chart for Email  Phone [phone]    Assistant contact:  Rubbie Battiest  Email: [email or web address]  Phone:[phone]

## 2017-10-12 NOTE — Unmapped (Signed)
Gastroenterology Return Visit Note      HISTORY OF PRESENT ILLNESS: This is a 67 y.o. year old female with a history of Crohn's disease as outlined below. She  looses weight again after stopping the steroids a few months ago.  She has again loose bowel movements  3-4 times daily, no blood mixed in the bowel movements. She takes Imodium to reduce frequency. However she can do it only for 1 day on the next day she has otherwise headaches if she would take it again. Some recurrent abdominal pain. No fever, no extraintestinal manifestations of inflammatory bowel disease. Her recent CT in 01/2017 showed  several short inflammatory small bowel segments. Her adalimumab level are high on weekly dosing.      PAST MEDICAL HISTORY:    Past Medical History:   Diagnosis Date   ??? Crohn's disease (CMS-HCC)    ??? Disease of thyroid gland    ??? Hypertension        PAST SURGICAL HISTORY:    Past Surgical History:   Procedure Laterality Date   ??? ABDOMINAL SURGERY      colon resection   ??? BUNIONECTOMY Left     with pins   ??? COLON SURGERY      resection   ??? FOOT SURGERY Left     bunionectomy; pins placed   ??? PR COLONOSCOPY FLX DX W/COLLJ SPEC WHEN PFRMD N/A 08/03/2012    Procedure: COLONOSCOPY, FLEXIBLE, PROXIMAL TO SPLENIC FLEXURE; DIAGNOSTIC, W/WO COLLECTION SPECIMEN BY BRUSH OR WASH;  Surgeon: Trula Slade, MD;  Location: [facility];  Service: Gastroenterology   ??? PR COLONOSCOPY FLX DX W/COLLJ SPEC WHEN PFRMD N/A 10/24/2014    Procedure: COLONOSCOPY, FLEXIBLE, PROXIMAL TO SPLENIC FLEXURE; DIAGNOSTIC, W/WO COLLECTION SPECIMEN BY BRUSH OR WASH;  Surgeon: Trula Slade, MD;  Location: [facility];  Service: Gastroenterology   ??? PR COLONOSCOPY FLX DX W/COLLJ SPEC WHEN PFRMD N/A 04/08/2016    Procedure: COLONOSCOPY, FLEXIBLE, PROXIMAL TO SPLENIC FLEXURE; DIAGNOSTIC, W/WO COLLECTION SPECIMEN BY BRUSH OR WASH;  Surgeon: Modena Nunnery, MD;  Location: [facility];  Service: Gastroenterology         CURRENT MEDICATIONS:      Current Outpatient Medications:   ???  ADALIMUMAB PEN CITRATE FREE 40 MG/0.4 ML, Inject 0.4 mL (40 mg total) under the skin once a week., Disp: 12 each, Rfl: 3  ???  biotin 1 mg tablet, Take 1,000 mcg by mouth daily. , Disp: , Rfl:   ???  calcium-vitamin D (CALCIUM-VITAMIN D) 500 mg(1,250mg ) -200 unit per tablet, Take 1 tablet by mouth daily. , Disp: , Rfl:   ???  levothyroxine (SYNTHROID) 25 MCG tablet, Take 25 mcg by mouth daily at 0600. , Disp: , Rfl:   ???  metoprolol succinate (TOPROL-XL) 50 MG 24 hr tablet, Take 50 mg by mouth daily., Disp: , Rfl:   ???  multivitamin capsule, Take 1 capsule by mouth daily., Disp: , Rfl:   ???  cyanocobalamin (B-12 DOTS) 500 MCG tablet, Take by mouth., Disp: , Rfl:   ???  multivitamin,tx-minerals Tab, Take 1 tablet by mouth daily with breakfast. , Disp: , Rfl:   ???  predniSONE (DELTASONE) 10 MG tablet, Prednisone 40 mg for 2 weeks, then  30 mg, 20 mg, 15 mg, 10 mg, 5 mg each for 1 week, Disp: 180 tablet, Rfl: 0       VITAL SIGNS:  BP Readings from Last 4 Encounters:   10/12/17 129/65  07/06/17 145/66   03/20/17 140/60   01/12/17 145/65      Wt Readings from Last 4 Encounters:   10/12/17 50.8 kg (112 lb 1.6 oz)   07/06/17 52.4 kg (115 lb 8 oz)   03/20/17 50.4 kg (111 lb 1.8 oz)   01/12/17 50.8 kg (112 lb)        BMI: Estimated body mass index is 18.65 kg/m?? as calculated from the following:    Height as of 04/08/16: 165.1 cm (5' 5).    Weight as of this encounter: 50.8 kg (112 lb 1.6 oz).    BSA: Estimated body surface area is 1.53 meters squared as calculated from the following:    Height as of 04/08/16: 165.1 cm (5' 5).    Weight as of this encounter: 50.8 kg (112 lb 1.6 oz).      PHYSICAL EXAM:    Constitutional:   Alert, oriented x 3, no acute distress, well nourished, and well hydrated.   Mental Status:   Thought organized, appropriate affect, pleasantly interactive, not anxious appearing.   HEENT:   PEERL, conjunctiva clear, anicteric, oropharynx clear, neck supple, no LAD.   Respiratory: Clear to auscultation, unlabored breathing.     Cardiac: Euvolemic, regular rate and rhythm, normal S1 and S2, no murmur.     Abdomen: Soft, normal bowel sounds, non-distended, non-tender, no organomegaly or masses.     Perianal/Rectal Exam Not performed.     Extremities:   No edema, well perfused.   Musculoskeletal: No joint swelling or tenderness noted, no deformities.     Skin: No rashes, jaundice or skin lesions noted.     Neuro: No focal deficits.          ASSESSMENT:    1. Crohns disease with Hx of small bowel resection due to stricturing and fistulizing luminal disease in 2008 and recurrence at anastomosis in 02/2010. Start of Humira in 04/2010       Course of Disease:  First diagnosis of Crohn's disease in approximately the late 1980s. At that time, she had cramping abdominal pain and diarrhea. She was started on a steroid taper and maintained on sulfasalazine. Remission until approximately 2004. In 2004 start of abdominal bloating, epigastric pain, and intermittent loose stools. 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 Start of ciprofloxacin therapy approximately in 2005 as well as Entocort and of Pentasa 4 g daily. Approximately in the beginning of 2007 decrease of entocort to 6 mg per day, however, still abdominal pain, bloating. 05/08/06 Ileocecal resection with ileostomy and repair of ileal fistula as well as takedown and repair of ileal sigmoid fistula with sigmoid colectomy (About 18 inches of small bowel and 4 inches of cecum/colon were resected). 06/22/06,ileostomy takedown with ileal colonic anastomosis. 11/2006 remission. Intolerance of 6-MP therapy (joint pain, hair loss, nausea).12/08Start of sulfasalazine due to joint pain. 5/09-10/2009 remission. 04/2010, start of Humira. 09/2010 -02/2012 clinical remission. 03/2014 mild symptoms in the settings of missing a dose of Humira 07/2014 remission but another interruption of Humira therapy for 4 weeks due to foot surgery 01/2015 increase to weekly Humira therapy in the setting of I3 recurrence 01/2017 continues weight loss on weekly Humira therapy 02/2017 CT showing several inflammatory small bowel segments 03/2017 start of prednisone taper , improvement 09/2017 after end of steroid taper recurrence of diarrhea despite weekly Humira, weight loss and mild abdominal pain.       Montreal Classification: A2L3B3     Crohn's disease:  We will start another steroid taper and stop Humira now due to inefficacy. The plan is to screen her fo a clinical study comparing ustekinumab with guselkumab in approx. 4-6 weeks. We will contact her in 4 weeks and see how she is doing on 20 mg prednisone.     We will continue with imodium 1 tabl q 2 days (due to headache).     Labs: WBC, HGB in normal range:    Slowly declining B12: B12 in lower range but stable. We will start sublingual substitution to get it in the high normal range.     Health maintenance: Prevnar 2015 and 2016. Pneumovax 2016. 10/2017 Shingrix    PLAN:  Patient Instructions   Stop Humira. You can bring [place] the remaining Humira pens in a cool bag next time, this would be great.    Prednisone 40 mg for 2 weeks, then  30 mg, 20 mg, 15 mg, 10 mg, 5 mg each for 1 week       We will call you in 4 weeks and see how you are doing and see how we time the study screening visit.     Shingles vaccine today (Shingrix) and in 2-6 months    We will call you if any labs are significantly abnormal.  Please contact us by phone or email or using my chart if you have any questions.    For more information about inflammatory bowel diseases please also go to Crohn's and Colitis Foundation ([email or web address]  Professor of Medicine  Department of Medicine  [place] [facility]    Nurse contact:  Alesia Banda   Please use my chart for Email  Phone [phone]    Assistant contact: Rubbie Battiest  Email: [email or web address]  [phone]                DIAGNOSTIC STUDIES:  I have reviewed all pertinent diagnostic studies, including:    Colonoscopy 04/2016: There was evidence of a prior end-to-side ileo-colonic anastomosis in        the ascending colon. This was patent and was characterized by edema,        erythema and ulceration. The anastomosis was traversed. Rutgeerts i2-i3.       The terminal ileum appeared normal. should perform another                        colonoscopy for assessment of inflammatuion in 12-18                        months.  Laboratory results    All Labs    Lab Results   Component Value Date    AADAL <25 10/27/2014     Lab Results   Component Value Date    ADALIMUMAB 31.7 01/12/2017    ADALIMUMAB 5.1 10/27/2014       Office Visit on 10/12/2017   Component Date Value Ref Range Status   ??? CRP 10/12/2017 <5.0  <10.0 mg/L Final   ??? Sodium 10/12/2017 141  135 - 145 mmol/L Final   ??? Potassium 10/12/2017 4.0  3.5 - 5.0 mmol/L Final   ??? Chloride 10/12/2017 109* 98 - 107 mmol/L Final   ??? CO2 10/12/2017 29.0  22.0 - 30.0 mmol/L Final   ??? Anion Gap 10/12/2017 3* 9 - 15 mmol/L Final   ??? BUN 10/12/2017 13  7 - 21 mg/dL Final   ???  Creatinine 10/12/2017 0.67  0.60 - 1.00 mg/dL Final   ??? BUN/Creatinine Ratio 10/12/2017 19   Final   ??? EGFR CKD-EPI Non-African American,* 10/12/2017 >90  >=60 mL/min/1.19m2 Final   ??? EGFR CKD-EPI African American, Fem* 10/12/2017 >90  >=60 mL/min/1.13m2 Final   ??? Glucose 10/12/2017 75  65 - 99 mg/dL Final   ??? Calcium 95/62/1308 9.9  8.5 - 10.2 mg/dL Final   ??? Alkaline Phosphatase 10/12/2017 92  38 - 126 U/L Final   ??? AST 10/12/2017 25  14 - 38 U/L Final   ??? ALT 10/12/2017 22  15 - 48 U/L Final   ??? Ferritin 10/12/2017 53.4  3.0 - 151.0 ng/mL Final   ??? WBC 10/12/2017 9.0  4.5 - 11.0 10*9/L Final   ??? RBC 10/12/2017 4.29  4.00 - 5.20 10*12/L Final   ??? HGB 10/12/2017 12.9  12.0 - 16.0 g/dL Final   ??? HCT 65/78/4696 39.9  36.0 - 46.0 % Final   ??? MCV 10/12/2017 93.0  80.0 - 100.0 fL Final   ??? MCH 10/12/2017 30.1  26.0 - 34.0 pg Final   ??? MCHC 10/12/2017 32.4  31.0 - 37.0 g/dL Final   ??? RDW 29/52/8413 13.3  12.0 - 15.0 % Final   ??? MPV 10/12/2017 8.0  7.0 - 10.0 fL Final   ??? Platelet 10/12/2017 250  150 - 440 10*9/L Final   ??? Neutrophils % 10/12/2017 56.8  % Final   ??? Lymphocytes % 10/12/2017 28.1  % Final   ??? Monocytes % 10/12/2017 9.2  % Final   ??? Eosinophils % 10/12/2017 2.5  % Final   ??? Basophils % 10/12/2017 0.3  % Final   ??? Absolute Neutrophils 10/12/2017 5.1  2.0 - 7.5 10*9/L Final   ??? Absolute Lymphocytes 10/12/2017 2.5  1.5 - 5.0 10*9/L Final   ??? Absolute Monocytes 10/12/2017 0.8  0.2 - 0.8 10*9/L Final   ??? Absolute Eosinophils 10/12/2017 0.2  0.0 - 0.4 10*9/L Final   ??? Absolute Basophils 10/12/2017 0.0  0.0 - 0.1 10*9/L Final   ??? Large Unstained Cells 10/12/2017 3  0 - 4 % Final   ??? Vitamin B-12 10/12/2017 244  193 - 900 pg/ml Final

## 2017-10-13 LAB — VITAMIN B-12: Cobalamins:MCnc:Pt:Ser/Plas:Qn:: 244

## 2017-11-27 MED ORDER — PREDNISONE 5 MG TABLET
Freq: Every day | ORAL | 1 refills | 0.00000 days | Status: CP
Start: 2017-11-27 — End: 2018-06-12

## 2017-11-27 NOTE — Unmapped (Signed)
Per Dr. Gwenith Spitz, patient is continuing with 5mg  QD prednisone, refills sent.

## 2017-12-18 ENCOUNTER — Encounter: Admit: 2017-12-18 | Discharge: 2017-12-19 | Payer: MEDICARE

## 2017-12-18 ENCOUNTER — Ambulatory Visit: Admit: 2017-12-18 | Discharge: 2017-12-19 | Payer: MEDICARE

## 2017-12-18 DIAGNOSIS — Z006 Encounter for examination for normal comparison and control in clinical research program: Principal | ICD-10-CM

## 2017-12-22 NOTE — Unmapped (Signed)
Patient was screened for study [id number] (A Phase 2/3, Randomized, Double-blind, Placebo- and Active-controlled, Parallel-group, Multicenter Protocol to Evaluate the Efficacy and Safety of Guselkumab in Participants with Moderately to Severely Active Crohn's Disease).  This study is looking at Guselkumab vs. Stelara vs. Placebo.  Treating Sub-Investigator, Dr. Britt Bolognese was present during the screening visit.  He performed a physical exam with no abnormal findings nor any currently active Extra-Intestinal Manifestations.  An ECG was performed resulting in normal sinus rhythm and normal ECG.  Medical history and concomitant medications were reviewed. Patient had screening laboratory assessments and chest x-ray completed.      Patient's next visit is scheduled for 23OCT2019.  She will come back for a screening colonoscopy with sub-I Cedric Fishman.

## 2017-12-27 ENCOUNTER — Encounter: Admit: 2017-12-27 | Discharge: 2017-12-27 | Payer: MEDICARE | Attending: Anesthesiology | Primary: Anesthesiology

## 2017-12-27 ENCOUNTER — Encounter: Admit: 2017-12-27 | Discharge: 2017-12-27 | Payer: MEDICARE

## 2017-12-27 DIAGNOSIS — K5000 Crohn's disease of small intestine without complications: Principal | ICD-10-CM

## 2017-12-27 NOTE — Unmapped (Signed)
PRE-PROCEDURE HISTORY AND PHYSICAL EXAM    Karen Snow presents for her scheduled COLONOSCOPY, FLEXIBLE, PROXIMAL TO SPLENIC FLEXURE; DIAGNOSTIC, W/WO COLLECTION SPECIMEN BY BRUSH OR WASH.    The indication for the procedure(s) is Crohn's disease, research scope.    There have been no significant recent changes in the patient's medical status.    Past Medical History:   Diagnosis Date   ??? Crohn's disease (CMS-HCC)    ??? Disease of thyroid gland    ??? Hypertension      Past Surgical History:   Procedure Laterality Date   ??? ABDOMINAL SURGERY      colon resection   ??? BUNIONECTOMY Left     with pins   ??? COLON SURGERY      resection   ??? FOOT SURGERY Left     bunionectomy; pins placed   ??? PR COLONOSCOPY FLX DX W/COLLJ SPEC WHEN PFRMD N/A 08/03/2012    Procedure: COLONOSCOPY, FLEXIBLE, PROXIMAL TO SPLENIC FLEXURE; DIAGNOSTIC, W/WO COLLECTION SPECIMEN BY BRUSH OR WASH;  Surgeon: Trula Slade, MD;  Location: [facility];  Service: Gastroenterology   ??? PR COLONOSCOPY FLX DX W/COLLJ SPEC WHEN PFRMD N/A 10/24/2014    Procedure: COLONOSCOPY, FLEXIBLE, PROXIMAL TO SPLENIC FLEXURE; DIAGNOSTIC, W/WO COLLECTION SPECIMEN BY BRUSH OR WASH;  Surgeon: Trula Slade, MD;  Location: [facility];  Service: Gastroenterology   ??? PR COLONOSCOPY FLX DX W/COLLJ SPEC WHEN PFRMD N/A 04/08/2016    Procedure: COLONOSCOPY, FLEXIBLE, PROXIMAL TO SPLENIC FLEXURE; DIAGNOSTIC, W/WO COLLECTION SPECIMEN BY BRUSH OR WASH;  Surgeon: Modena Nunnery, MD;  Location: [facility];  Service: Gastroenterology       Allergies  No Known Allergies    Medications  biotin, calcium-vitamin D, cyanocobalamin, levothyroxine, metoprolol succinate, multivitamin, (multivitamin,tx-minerals), and predniSONE    Physical Examination  Vitals:    12/27/17 0950   BP: 116/60   Pulse: 66   Resp: 19   Temp:    SpO2: 100%     Body mass index is 19.14 kg/m??.    Mental Status: AAOx3, thoughts organized     Lungs: Clear to auscultation, unlabored breathing     Heart: Regular rate and rhythm, normal S1 and S2, no murmur     Abdomen: Soft, non-tender, non-distended         ASSESSMENT AND PLAN  Karen Snow has been evaluated and deemed appropriate to undergo the planned COLONOSCOPY, FLEXIBLE, PROXIMAL TO SPLENIC FLEXURE; DIAGNOSTIC, W/WO COLLECTION SPECIMEN BY BRUSH OR WASH.    The patient, or her authorized representative, was provided a printed handout that explained the nature and benefits of the procedure(s), the most frequent risks, and alternatives, if any.  I personally reviewed this information with the patient, or her authorized representative, and answered all questions.

## 2018-01-16 ENCOUNTER — Encounter: Admit: 2018-01-16 | Discharge: 2018-01-17 | Payer: MEDICARE

## 2018-01-16 DIAGNOSIS — Z006 Encounter for examination for normal comparison and control in clinical research program: Principal | ICD-10-CM

## 2018-01-16 NOTE — Unmapped (Signed)
[facility] Mult[facility]isciplinary IBD Center       Clinical Trial Visit Note      I saw Karen Snow for ongoing care as part of a clinical trial. I personally interviewed and examined the patient and reviewed relevant GI/IBD history.  Full details including symptom diary can be found in the clinical trial record.   The patient will continue in the clinical trial screening as planned.     Additional information: none.    Investigator:  Trula Slade 01/16/2018.

## 2018-01-17 NOTE — Unmapped (Signed)
Patient was screened for study 15-0427 (A Phase III, Randomized, Double-Blind, Placebo-Controlled, Multicenter Study To Evaluate the Efficacy and Safety of Etrolizumab As An Induction and Maintenance Treatment for Patients with Moderately to Severely Active Crohn???s Disease (Protocol Number GN56213)).  This study is looking at Etrolizumab vs. Placebo.  Treating Sub-Investigator, Dr. Britt Bolognese was present during the screening visit.  He performed a physical exam with no abnormal findings.  An ECG was performed.  Medical history and concomitant medications were reviewed. Patient had screening laboratory assessments.    ??  Patient's next visit is scheduled for 22NOV2019.  She will come back for a screening colonoscopy with sub-I Britt Bolognese.

## 2018-01-26 ENCOUNTER — Encounter: Admit: 2018-01-26 | Discharge: 2018-01-26 | Payer: MEDICARE

## 2018-01-26 ENCOUNTER — Encounter
Admit: 2018-01-26 | Discharge: 2018-01-26 | Payer: MEDICARE | Attending: Student in an Organized Health Care Education/Training Program | Primary: Student in an Organized Health Care Education/Training Program

## 2018-01-26 DIAGNOSIS — K5000 Crohn's disease of small intestine without complications: Principal | ICD-10-CM

## 2018-01-26 NOTE — Unmapped (Signed)
PRE-PROCEDURE HISTORY AND PHYSICAL EXAM    Karen Snow presents for her scheduled COLONOSCOPY, FLEXIBLE, PROXIMAL TO SPLENIC FLEXURE; DIAGNOSTIC, W/WO COLLECTION SPECIMEN BY BRUSH OR WASH.    The indication for the procedure(s) is COLON.    There have been no significant recent changes in the patient's medical status.    Past Medical History:   Diagnosis Date   ??? Crohn's disease (CMS-HCC)    ??? Disease of thyroid gland    ??? Hypertension      Past Surgical History:   Procedure Laterality Date   ??? ABDOMINAL SURGERY      colon resection   ??? BUNIONECTOMY Left     with pins   ??? COLON SURGERY      resection   ??? FOOT SURGERY Left     bunionectomy; pins placed   ??? PR COLONOSCOPY FLX DX W/COLLJ SPEC WHEN PFRMD N/A 08/03/2012    Procedure: COLONOSCOPY, FLEXIBLE, PROXIMAL TO SPLENIC FLEXURE; DIAGNOSTIC, W/WO COLLECTION SPECIMEN BY BRUSH OR WASH;  Surgeon: Trula Slade, MD;  Location: [facility];  Service: Gastroenterology   ??? PR COLONOSCOPY FLX DX W/COLLJ SPEC WHEN PFRMD N/A 10/24/2014    Procedure: COLONOSCOPY, FLEXIBLE, PROXIMAL TO SPLENIC FLEXURE; DIAGNOSTIC, W/WO COLLECTION SPECIMEN BY BRUSH OR WASH;  Surgeon: Trula Slade, MD;  Location: [facility];  Service: Gastroenterology   ??? PR COLONOSCOPY FLX DX W/COLLJ SPEC WHEN PFRMD N/A 04/08/2016    Procedure: COLONOSCOPY, FLEXIBLE, PROXIMAL TO SPLENIC FLEXURE; DIAGNOSTIC, W/WO COLLECTION SPECIMEN BY BRUSH OR WASH;  Surgeon: Modena Nunnery, MD;  Location: [facility];  Service: Gastroenterology   ??? PR COLONOSCOPY FLX DX W/COLLJ SPEC WHEN PFRMD N/A 12/27/2017    Procedure: COLONOSCOPY, FLEXIBLE, PROXIMAL TO SPLENIC FLEXURE; DIAGNOSTIC, W/WO COLLECTION SPECIMEN BY BRUSH OR WASH;  Surgeon: Rona Ravens, MD;  Location: [facility];  Service: Gastroenterology       Allergies  No Known Allergies    Medications  biotin, calcium-vitamin D, cyanocobalamin, levothyroxine, metoprolol succinate, multivitamin, (multivitamin,tx-minerals), and predniSONE    Physical Examination  There were no vitals filed for this visit.  There is no height or weight on file to calculate BMI.    Mental Status: AAOx3, thoughts organized     Lungs: Clear to auscultation, unlabored breathing     Heart: Regular rate and rhythm, normal S1 and S2, no murmur     Abdomen: Soft, non-tender, non-distended         ASSESSMENT AND PLAN  Karen Snow has been evaluated and deemed appropriate to undergo the planned COLONOSCOPY, FLEXIBLE, PROXIMAL TO SPLENIC FLEXURE; DIAGNOSTIC, W/WO COLLECTION SPECIMEN BY BRUSH OR WASH.    The patient, or her authorized representative, was provided a printed handout that explained the nature and benefits of the procedure(s), the most frequent risks, and alternatives, if any.  I personally reviewed this information with the patient, or her authorized representative, and answered all questions.

## 2018-02-19 ENCOUNTER — Encounter: Admit: 2018-02-19 | Discharge: 2018-02-20 | Payer: MEDICARE

## 2018-02-19 DIAGNOSIS — Z006 Encounter for examination for normal comparison and control in clinical research program: Principal | ICD-10-CM

## 2018-02-19 NOTE — Unmapped (Signed)
[facility] Multidisciplinary IBD Center       Clinical Trial Visit Note      I saw Karen Snow for ongoing care as part of a clinical trial. I personally interviewed and examined the patient and reviewed relevant GI/IBD history.  Full details including symptom diary can be found in the clinical trial record.   The patient will continue in the clinical trial as planned.     Additional information: none.    Investigator:  Trula Slade 02/19/2018.

## 2018-02-19 NOTE — Unmapped (Signed)
Karen Snow presented to the [facility] for her scheduled Week 0 visit for [id number].  Dr. Britt Bolognese performed the physical exam.  There were no abnormal findings nor any currently active Extra-Intestinal Manifestations.  Dr. Gwenith Spitz performed the PML assessment and there were no abnormal findings. Karen Snow did report taking Imodium once during the last week.     [place] conducted the Week 0 visit and performed all procedures per study schedule of events.  Details are included in study specific Karen Snow chart.    [facility] staff collected vitals and kit labs.  [place] processed and shipped labs same day.    Karen Snow was randomized using ClinPhone and received two syringes ([id number] and [id number]).  [facility] staff administered both syringes. Karen Snow received 105mg  Etrolizumab/Placebo and 210mg  Etrolizumab/Placebo.    Karen Snow was monitored for one hour post dose and was discharged home after the one hour period.    Karen Snow is scheduled to come back for her Week 2 visit on 2JAN2019 with Dr. Gwenith Spitz.

## 2018-02-19 NOTE — Unmapped (Signed)
Pt has no c/o or problem at all by the 1251 pm, VSS, no reaction on the injection sites. Pt d/c to home at 1255 pm. Is checked out by coordinator without problem at all.

## 2018-02-19 NOTE — Unmapped (Signed)
Pt arrived to [facility] at 0958 am. Pt's VS and Wt are checked as per order. Pre kit lab is drawn at 1040 am with very good blood returned, Pt tolerated very well. The study drug SQ injection is given as per order at 1150 and 1151 am per protocol. Pt tolerated very good by 1213 pm. ( see the flow sheet ).

## 2018-02-21 NOTE — Unmapped (Signed)
Dr. Gwenith Spitz did not complete the PML assessment during the Week 0 visit.  He did complete a PE with no abnormal findings.

## 2018-03-08 ENCOUNTER — Institutional Professional Consult (permissible substitution): Admit: 2018-03-08 | Discharge: 2018-03-09 | Payer: MEDICARE

## 2018-03-08 DIAGNOSIS — Z006 Encounter for examination for normal comparison and control in clinical research program: Principal | ICD-10-CM

## 2018-03-08 NOTE — Unmapped (Signed)
Pt arrived to the [facility] at 1001. Patient met by coordinator [facility] and escorted to infusion bay. Weight obtained per nurse at 1011 and patient taken back to infusion chair # 3. Instructed to sit quietly for at least 5 minutes prior to obtaining VS. Rest time started 1013; VS obtained at 1022; see Epic and flow sheet. No c/o voiced by patient. No blood work or urine collection required with today's visit. Meal tray offered to patient but declined.

## 2018-03-08 NOTE — Unmapped (Signed)
[facility] medication obtained from [facility]. Etrolizumab/placebo 1.4 ml/201mg /placebo given subcutaneously in left lower abdomen at 1146. Patient decided to get lunch tray which was ordered. Lunch served during observation period. 1 hour observation ended at 1246. VS taken; stable; see Epic and flow sheet. Patient discharged home at 1251. Medication box returned to pharmacy. No c/o voiced by patient at time of discharge.

## 2018-03-19 ENCOUNTER — Encounter: Admit: 2018-03-19 | Discharge: 2018-03-20 | Payer: MEDICARE

## 2018-03-19 DIAGNOSIS — Z006 Encounter for examination for normal comparison and control in clinical research program: Principal | ICD-10-CM

## 2018-03-19 NOTE — Unmapped (Signed)
Lunch given at 1110. Study medication obtained from [facility].Study medication given subcutaneously at 1104 and 1146 - see flow sheet/MAR.1 hour observation ended at 1246. VS taken; stable; see Epic and flow sheet.Injections sited checked, no s/sx of allergic reactions noted. Patient discharged home at 1247. Medication box returned to pharmacy. No c/o voiced by patient at time of discharge.

## 2018-03-19 NOTE — Unmapped (Signed)
The study drug SQ injection is given as per order at 1104 am -- 105 mg on L upper abdomen without problem at all. The second syring ( 210 mg , when start to give injected thought skin, needle bending 90 degree)  can not used at this moment. Coordinator help to replaced a new pre fill syring 210 mg and given at 1146 am on  R upper abdomen without problem at all. Pt tolerated very well and no c/o at all by 1210 pm.

## 2018-03-19 NOTE — Unmapped (Signed)
I co-sign the nursing note with Robyn Haber LPN for today 's  Pt care.

## 2018-03-19 NOTE — Unmapped (Signed)
Pt arrived to the [facility] at 1000. Patient met by coordinator Irving Burton and escorted to infusion bay. Weight obtained in triage at 1002 and patient taken back to infusion chair # 2. Instructed to sit quietly for at least 5 minutes prior to obtaining VS. Rest time started 1005; VS obtained at 1010; see Epic and flow sheet. Physical exam performed by Dr Gwenith Spitz prior to drug administration.Kit labs obtained using butterfly needle in LAC at 1020, minimal bleeding noted. Lunch ordered at 1030. Patient resting quietly in recliner awaiting study medication.No c/o voiced by patient.

## 2018-03-21 NOTE — Unmapped (Signed)
Subject presented to the [facility] for Week 4 of Protocol [phone]. Dr. Britt Bolognese performed the physical exam.  There were no abnormal findings nor any currently active Extra-Intestinal Manifestations.  Subject did report taking Imodium once during the last week.  Dr. Gwenith Spitz discussed diary with Subject.  There has been no improvement in symptoms since Week 0.  Dr. Gwenith Spitz informed Subject that he was not surprised as it takes time for this drug to start working in the body.  Subject verbalized her understanding and did not have any questions for Dr. Gwenith Spitz.  Dr. Gwenith Spitz discussed Subject continuing on in the study.  Both Dr. Gwenith Spitz and Subject were in agreement to continue.  Dr. Gwenith Spitz cleared Subject for treatment.       [place] Irving Burton Brandilee Pies conducted the Week 4 visit. [facility] staff collected vital signs. [place] performed all procedures per study schedule of events.      Subject reported that there have been no changes in concomitant medications nor did she take any new medications. Subject did not report any AE???s.     Week 4 was registered in ClinPhone and Subject was dispensed two syringes:    [id number]  [id number]  [facility] staff was able to administer [id number].  When trying to administer [id number], the needle bent in a 90 degree angle and was not able to be administered (see picture in chart).  Per [facility], a new order signed by Dr. Gwenith Spitz was not needed, just a replacement kit number from ClinPhone.  [place] completed the Medication Replacement in ClinPhone. ClinPhone assigned replacement kit: [id number].  [facility] staff was able to administer this syringe.  Time and location of both syringe administrations are documented on [facility] flowsheet.    Subject is scheduled to return for her Week 4 visit on 10FEB2020 with Dr. Cedric Fishman.  [place] reminded Subject to continue to fill out her diary and to contact [employer] with any questions or concerns.

## 2018-04-16 ENCOUNTER — Encounter: Admit: 2018-04-16 | Discharge: 2018-04-16 | Payer: MEDICARE

## 2018-04-16 DIAGNOSIS — Z006 Encounter for examination for normal comparison and control in clinical research program: Principal | ICD-10-CM

## 2018-04-16 NOTE — Unmapped (Signed)
Pt arrived to??the??[facility] at 0955.??Patient met by coordinator Irving Burton and escorted to infusion bay. Weight obtained in triage and patient taken back to infusion chair # 2. Instructed to sit quietly for at least 5 minutes prior to obtaining VS. Rest time started 1001; VS obtained at 1006; see Epic and flow sheet. Physical exam performed by Dr Gwenith Spitz prior to drug administration.Kit labs obtained using butterfly needle in LAC at 1009, minimal bleeding noted. Lunch ordered at 1030. Patient resting quietly in recliner awaiting study medication.No c/o voiced by patient.

## 2018-04-16 NOTE — Unmapped (Signed)
Lunch given at 1230. Study medication obtained from [facility].Study medication given subcutaneously at 1216 and 1217 - see flow sheet/MAR.1 hour observation ended at 1317. VS taken; stable; see Epic and flow sheet.Injections sited checked, no s/sx of allergic reactions noted. Patient discharged home at 1319. Medication box returned to pharmacy. No c/o voiced by patient at time of discharge.

## 2018-04-16 NOTE — Unmapped (Signed)
Nursing notes of Robyn Haber LPN reviewed. Agree with documentation.

## 2018-04-17 NOTE — Unmapped (Signed)
Subject reported to the [facility] for her Week 8 visit for the [id number] study (Etrolizumab vs. Placebo).  Vital signs were taken upon arrival.  Sub-Investigator, Dr. Cedric Fishman was present for part of the visit.  Dr. Erma Heritage reviewed Subject's diary and symptoms with Subject.  Dr. Erma Heritage and Subject discussed continuing on in the study and both were in agreement for Subject to continue.  Dr. Erma Heritage cleared Subject for treatment.    [place] reviewed AEs and conmeds with Subject.  Subject did not report any changes in current conmeds nor did she report starting any new ones.  No AEs were reported.    Subject was scheduled for a re-supply in ClinPhone and was assigned kits: [id number] and [id number].    [place] discussed self-administration with Subject.  Subject stated she no longer felt comfortable self-administering due to one of the needles bending upon administration at her Week 4 visit.  Subject asked that all administrations of study drug be done by [facility] nurses.    [facility] nurses administered both syringes for the Week 8 visit.  Details are listed in Subject's study-specific chart.    Subject was observed for injection site reactions for 60 minutes post dose- no reactions were noted.  Subject had vitals taken and then was discharged home in good condition.    Subject will return to the [facility] on 24FEB2020 for her Week 10 visit with Dr. Gwenith Spitz.

## 2018-04-23 MED ORDER — PEG-ELECTROLYTE SOLUTION 420 GRAM ORAL SOLUTION
0 refills | 0 days | Status: CP
Start: 2018-04-23 — End: ?

## 2018-04-30 ENCOUNTER — Encounter: Admit: 2018-04-30 | Discharge: 2018-04-30 | Payer: MEDICARE

## 2018-04-30 DIAGNOSIS — Z23 Encounter for immunization: Principal | ICD-10-CM

## 2018-04-30 DIAGNOSIS — Z006 Encounter for examination for normal comparison and control in clinical research program: Principal | ICD-10-CM

## 2018-04-30 NOTE — Unmapped (Signed)
Pt here today in clinic for #2 Shingrix vaccine and given IM in left deltoid muscle.  Series complete.  Tolerated well without complaints and ambulated from clinic.

## 2018-04-30 NOTE — Unmapped (Signed)
[facility] Multidisciplinary IBD Center       Clinical Trial Visit Note      I saw Mulki Roesler Paprocki for ongoing care as part of a clinical trial. I personally interviewed and examined the patient and reviewed relevant GI/IBD history.  Full details including symptom diary can be found in the clinical trial record.   The patient will continue in the clinical trial as planned.     Additional information: none.    Investigator:  Trula Slade 04/30/2018.

## 2018-05-03 NOTE — Unmapped (Signed)
Subject presented to the [facility] for a scheduled Week 10 visit for the Etro 144 study (Etrolizumab vs. Placebo).  Vital signs were taken upon arrival.  Treating sub-investigator, Dr. Britt Bolognese was present for the visit.  Dr. Gwenith Spitz performed a physical exam.  Dr. Gwenith Spitz noted that all systems were normal except for the musculoskeletal system.  He reported pain left lower thorax above rib.  No indication of fracture.  Subject complain of rib pain after working in the yard on 17FEB2020.  Dr. Gwenith Spitz did not note any extraintestinal manifestations.  Dr. Gwenith Spitz reviewed Subject's diary.    Both Dr. Gwenith Spitz and Subject were in agreement to continue with study.    Subject reported taking Advil for her rib pain on 23FEB2020.  No other changes to conmeds were reported.  Pain left lower thorax was added to AE log.  Dr. Gwenith Spitz graded this AE as moderate and not related to the study.  No other AEs were reported.    Subject had kit labs.  Labs were processed and shipped same day by [place].     Subject's CDAI was calculated.  Her CDAI at Week 10 was 238 while her CDAI at Week 0 was 389.  Subject will not escape to OLE and will continue with induction as her CDAI score did not get worse.      Subject is scheduled for her Week 12 visit on 9MAR2020 and her Week 14 endoscopy on 23MAR2020.

## 2018-05-14 ENCOUNTER — Encounter: Admit: 2018-05-14 | Discharge: 2018-05-14 | Payer: MEDICARE

## 2018-05-14 DIAGNOSIS — Z006 Encounter for examination for normal comparison and control in clinical research program: Principal | ICD-10-CM

## 2018-05-14 NOTE — Unmapped (Signed)
Patient arrived for protocol 15-0427. Weight obtained in triage. Patient met with study coordinator and Dr. Gwenith Spitz. Vital signs obtained after >5 minutes resting in chair. Study drug checked with Awilda Bill RN and administered in R lower abdomen and L lower abdomen. Per orders, no observation period today. Patient was discharged from [facility] with no complaints voiced or noted.

## 2018-05-15 NOTE — Unmapped (Signed)
[facility] Multidisciplinary IBD Center       Clinical Trial Visit Note      I saw Karen Snow for ongoing care as part of a clinical trial. I personally interviewed and examined the patient and reviewed relevant GI/IBD history.  Full details including symptom diary can be found in the clinical trial record.   The patient will continue in the clinical trial as planned.     Additional information: none.    Investigator:  Trula Slade 05/15/2018.

## 2018-05-15 NOTE — Unmapped (Signed)
Subject presented to the [facility] for a scheduled Week 12 visit for the Etro 144 study (Etrolizumab vs. Placebo).  Treating sub-investigator, Dr. Britt Bolognese was present for the visit.  No PE required per protocol.  Dr. Gwenith Spitz reviewed Subject's eDiary entries and discussed with Subject.  Subject reports her CD symptoms remain unchanged since baseline (December 2019) and there are days she can't leave the house due to her symptoms. Subject tends to minimize her symptoms.  Dr. Gwenith Spitz and Study Coordinator reminded Subject that her diary needs to be reflective of how she really feels.   Subject verbalized her understanding.      Vital signs were taken by [facility] staff.    [place] discussed symptoms of the coronavirus with Subject. Subject reported that she has not traveled outside of [place] and denied fever and signs of a respiratory illness.  Subject denied having any close contact with a person who has confirmed or suspected COVID-19.    Subject has not had any changes in conmeds. Subject continues to feel pain in her left lower thorax and reports that this pain is getting better.  Subject has not taken any medications for this pain since the Advil taken on 23FEB2019.  Subject did not report any other AEs.    The visit was scheduled in [facility] and Subject was dispensed kits [id number] and [id number] (105mg /placebo and 210mg /placebo).  [facility] staff administered the syringes.  Subject was discharged home in good condition.    Subject is scheduled to return on 23MAR2020 for her Week 14 visit and colonoscopy.

## 2018-05-24 DIAGNOSIS — K644 Residual hemorrhoidal skin tags: Principal | ICD-10-CM

## 2018-05-24 DIAGNOSIS — Z006 Encounter for examination for normal comparison and control in clinical research program: Principal | ICD-10-CM

## 2018-05-24 DIAGNOSIS — K633 Ulcer of intestine: Principal | ICD-10-CM

## 2018-05-24 DIAGNOSIS — K5080 Crohn's disease of both small and large intestine without complications: Principal | ICD-10-CM

## 2018-05-24 DIAGNOSIS — Z965 Presence of tooth-root and mandibular implants: Principal | ICD-10-CM

## 2018-05-24 DIAGNOSIS — I10 Essential (primary) hypertension: Principal | ICD-10-CM

## 2018-05-24 DIAGNOSIS — E039 Hypothyroidism, unspecified: Principal | ICD-10-CM

## 2018-05-24 DIAGNOSIS — Z980 Intestinal bypass and anastomosis status: Principal | ICD-10-CM

## 2018-05-25 ENCOUNTER — Encounter: Admit: 2018-05-25 | Discharge: 2018-05-25 | Attending: Anesthesiology | Primary: Anesthesiology

## 2018-05-25 ENCOUNTER — Ambulatory Visit: Admit: 2018-05-25 | Discharge: 2018-05-25

## 2018-05-25 DIAGNOSIS — Z980 Intestinal bypass and anastomosis status: Principal | ICD-10-CM

## 2018-05-25 DIAGNOSIS — K5080 Crohn's disease of both small and large intestine without complications: Principal | ICD-10-CM

## 2018-05-25 DIAGNOSIS — E039 Hypothyroidism, unspecified: Principal | ICD-10-CM

## 2018-05-25 DIAGNOSIS — Z965 Presence of tooth-root and mandibular implants: Principal | ICD-10-CM

## 2018-05-25 DIAGNOSIS — I10 Essential (primary) hypertension: Principal | ICD-10-CM

## 2018-05-25 DIAGNOSIS — Z006 Encounter for examination for normal comparison and control in clinical research program: Principal | ICD-10-CM

## 2018-05-25 DIAGNOSIS — K5090 Crohn's disease, unspecified, without complications: Principal | ICD-10-CM

## 2018-05-25 DIAGNOSIS — E079 Disorder of thyroid, unspecified: Principal | ICD-10-CM

## 2018-05-25 DIAGNOSIS — K633 Ulcer of intestine: Principal | ICD-10-CM

## 2018-05-25 DIAGNOSIS — K644 Residual hemorrhoidal skin tags: Principal | ICD-10-CM

## 2018-05-25 NOTE — Unmapped (Signed)
Called Patient on 19MAR2020 at 18:30 to ask her COVID-19 screening questions before her study colonoscopy on 20MAR2020 at [facility]. She reported that she has not traveled anywhere within the last 14 days.  She did not report any fevers or any new or worsening respiratory symptoms.  She denies having any contact with a person with suspect or confirmed COVID-19.

## 2018-05-29 DIAGNOSIS — E079 Disorder of thyroid, unspecified: Principal | ICD-10-CM

## 2018-05-29 DIAGNOSIS — I10 Essential (primary) hypertension: Principal | ICD-10-CM

## 2018-05-29 DIAGNOSIS — K5090 Crohn's disease, unspecified, without complications: Principal | ICD-10-CM

## 2018-06-11 NOTE — Unmapped (Signed)
06/11/18    Travel Screening Questions Completed.    Travel Screening Questions/Answers:  1). Have you traveled within the last 14 days?: No  2). Do you have new or worsening respiratory symptoms (e.g. cough, difficulty breathing)?: No  3). Have you had close contact with a person with confirmed COVID-19 in the last 14 days before symptoms began?: No      Negative Travel Screen: Patient answered NO to questions 2 and 3. Proceed with current scheduling protocol for your clinic.       The call was handled in the following manner: Documented patient response and encounter closed

## 2018-06-12 ENCOUNTER — Encounter: Admit: 2018-06-12 | Discharge: 2018-06-13 | Payer: MEDICARE

## 2018-06-12 ENCOUNTER — Institutional Professional Consult (permissible substitution): Admit: 2018-06-12 | Discharge: 2018-06-13 | Payer: MEDICARE

## 2018-06-12 DIAGNOSIS — Z006 Encounter for examination for normal comparison and control in clinical research program: Principal | ICD-10-CM

## 2018-06-12 MED ORDER — PREDNISONE 5 MG TABLET
Freq: Every day | ORAL | 1 refills | 0.00000 days | Status: CP
Start: 2018-06-12 — End: ?

## 2018-06-12 NOTE — Unmapped (Incomplete)
Pt arrived to??the??[facility] at 1225 for protocol 15-1093. Patient awake, alert, ambulatory, no complaints. Patient met by coordinator??Irving Burton. Weight obtained??in triage??and patient taken to infusion chair # 2. Vital signs obtained after >5 minutes resting in chair.     Study medication obtained from [facility] and checked with Awilda Bill RN. Drug administered in L lower abdomen, see flow sheet/MAR. Patient rested comfortably for 1 hour observation. Lunch tray delivered to patient. VS taken; stable; see Epic and flow sheet. Injections sited checked, no s/sx of reactions noted. No complaints voiced by patient.??Study coordinator returned to discharge patient. Patient discharged home at 1430. Medication box returned to pharmacy.

## 2018-06-12 NOTE — Unmapped (Unsigned)
[facility]       Clinical Trial Visit Note      I saw Karen Snow for ongoing care as part of a clinical trial. I personally interviewed and examined the patient and reviewed relevant GI/IBD history.  Full details including symptom diary can be found in the clinical trial record.   The patient will continue in the clinical trial as planned.     Additional information: none.    Investigator:  Trula Slade 06/12/2018.

## 2018-06-12 NOTE — Unmapped (Signed)
[facility]       Clinical Trial Visit Note      I saw Karen Snow for ongoing care as part of a clinical trial. I personally interviewed and examined the patient and reviewed relevant GI/IBD history.  Full details including symptom diary can be found in the clinical trial record.   The patient will continue in the clinical trial as planned.     Additional information: none.    Investigator:  Trula Slade 06/12/2018.

## 2018-06-14 NOTE — Unmapped (Unsigned)
Subject presented to the [facility] on 07APR2020 for her Week 0 visit for the [id number] study (Open Label Etrolizumab).  After Patient signed the ICF, treating PI, Dr. Britt Bolognese was present for the visit.  Dr. Gwenith Spitz performed a PE and reviewed Subject's diary.  Full details of visit can be found in the Subject's research specific chart.

## 2018-06-14 NOTE — Unmapped (Signed)
Subject presented to the [facility] on 07APR2020 for her Early Withdrawal visit for the [id number] study (Etrolizumab vs. Placebo).  Treating Sub-I, Dr. Britt Bolognese was present for the visit.  Dr. Gwenith Spitz performed a PE and reviewed Subject's diary.  Full details of visit can be found in the Subject's research specific chart.

## 2018-07-11 NOTE — Unmapped (Signed)
Called patient at 11:37 to ask COVID screening questions.  The following questions were asked:    Have you traveled within the last 14 days?    Do you have any of the following symptoms that are new or worsening: cough, congestion, shortness of breath, loss of taste/smell, sore throat, fever or feeling feverish, chills, muscle pain, headache, nausea, vomiting, or diarrhea?    Have you had close contact with a person with confirmed COVID-19 in the last 21 days before symptoms began?    Have you tested positive in the last 21 days for COVID-19?    Patient answered no to all questions.

## 2018-07-12 ENCOUNTER — Institutional Professional Consult (permissible substitution): Admit: 2018-07-12 | Discharge: 2018-07-13 | Payer: MEDICARE

## 2018-07-12 DIAGNOSIS — Z006 Encounter for examination for normal comparison and control in clinical research program: Principal | ICD-10-CM

## 2018-07-12 NOTE — Unmapped (Signed)
Patient arrived to the [facility] at 1052 for protocol 15-1093. Weight and height obtained in triage and patient taken to an exam room per coordinator [facility] for physical assessment. Patient escorted to infusion bay after completion of assessment. Vital signs obtained at 1123 after >5 minutes resting in chair in sitting position. Study drug checked with [facility] RN and administered subcutaneoulsy at 1143 in left upper arm. Lunch delivered to patient during observation period. No c/o voiced.

## 2018-07-12 NOTE — Unmapped (Signed)
[facility]       Clinical Trial Visit Note      I saw Karen Snow for ongoing care as part of a clinical trial. I personally interviewed and examined the patient and reviewed relevant GI/IBD history.  Full details including symptom diary can be found in the clinical trial record.   The patient will continue in the clinical trial as planned.     Additional information: No change overall - GI symptoms unchanged.  Will continue low dose prednisone 5mg  for now.  Has ongoing right hip pain similar to before.    Investigator:  Zetta Bills 07/12/2018.

## 2018-07-12 NOTE — Unmapped (Signed)
Vitals taken at end of observation period, see flowsheet. Injection site L upper arm WNL. Patient spoke with coordinator before leaving. Patient was discharged with no complaints voiced or noted at 1246.

## 2018-07-16 NOTE — Unmapped (Signed)
Subject presented to the [facility] on 7MAY2020 for her Week 4 visit for the [id number] study (Open Label Etrolizumab).  Dr. Gwenith Spitz performed a PE and reviewed Subject's diary. ??Full details of visit can be found in the Subject's research specific chart.

## 2018-08-08 NOTE — Unmapped (Signed)
Called Patient at 11:05 on 3JUN2020 to ask the following COVID screening questions:    Have you traveled within the last 14 days?    Do you have any of the following symptoms that are new or worsening: cough, shortness of breath, loss of taste/smell, sore throat, fever or feeling feverish, repeated shaking chills, muscle pain, vomiting, or diarrhea?    Have you had close contact with a person with confirmed COVID-19 in the last 21 days before symptoms began?    Have you tested positive in the last 21 days for COVID-19?    Patient answered no to all questions.

## 2018-08-09 ENCOUNTER — Encounter: Admit: 2018-08-09 | Discharge: 2018-08-09 | Payer: MEDICARE

## 2018-08-09 DIAGNOSIS — Z006 Encounter for examination for normal comparison and control in clinical research program: Principal | ICD-10-CM

## 2018-08-09 NOTE — Unmapped (Signed)
Pt arrived to??the[facility] at??1021 for protocol 15-1093. Patient awake, alert, ambulatory, no complaints. Patient met by coordinator??Irving Burton. Weight obtained??in triage??and patient taken to infusion chair # 1. Vital signs obtained after >5 minutes resting in chair.     Dr Raphael Gibney saw patient prior to medication arrival. Study medication obtained from [facility] and checked??with Francine Graven RN. Drug administered in right upper arm, see flow sheet/MAR. Patient rested comfortably for 1 hour observation. Seymour Irving Burton spoke to patient during the observation period. Lunch tray delivered to patient. After one hour observation VS taken; stable; see Epic and flow sheet. Injections sited checked, no s/sx of reactions noted. No complaints voiced by patient.??Patient discharged home at 1320. Medication box returned to pharmacy.

## 2018-08-09 NOTE — Unmapped (Signed)
[facility]       Clinical Trial Visit Note      I saw Karen Snow for ongoing care as part of a clinical trial. I personally interviewed and examined the patient and reviewed relevant GI/IBD history.  Full details including symptom diary can be found in the clinical trial record.   The patient will continue in the clinical trial as planned.     Additional information: Doing well.  Still has left hip pain. Will taper prednisone. to 2.5mg  daily for 2 weeks, then stop.    Investigator:  Zetta Bills 08/09/2018.

## 2018-08-10 NOTE — Unmapped (Signed)
Patient presented to the [facility] on 4JUN2020 for her Week 8 visit for the [id number] study (open label Etrolizumab).  Dr. Zetta Bills was present for the visit.  Details of the visit can be found in the study specific research chart.

## 2018-09-02 NOTE — Unmapped (Signed)
Called Patient on 28JUN2020 at 11:13 to ask the following COVID screening questions:    Have you traveled within the last 14 days?     Do you have any of the following symptoms that are new or worsening: cough, shortness of breath, loss of taste/smell, sore throat, fever or feeling feverish, repeated shaking, chills, muscle pain, vomiting, or diarrhea?     Have you had close contact with a person with confirmed COVID-19 in the last 21 days before symptoms began?      Have you tested positive for COVID-19 within the last 21 days?      Subject answered no to all questions.

## 2018-09-03 ENCOUNTER — Encounter: Admit: 2018-09-03 | Discharge: 2018-09-04 | Payer: MEDICARE

## 2018-09-03 DIAGNOSIS — Z006 Encounter for examination for normal comparison and control in clinical research program: Principal | ICD-10-CM

## 2018-09-03 NOTE — Unmapped (Signed)
Subject presented to the [facility] on 29JUN2020 for her Week 12 visit for [id number] (open label Etrolizumab).  Treating PI, Dr. Britt Bolognese was present for the visit.  Details of the visit can be found in the research specific chart.

## 2018-09-03 NOTE — Unmapped (Signed)
Patient arrived to the [facility] at 1020 for protocol 15-1093. Weight obtained in triage and patient taken to infusion chair # 2. Irving Burton, study coordinator met with patient. Patient was seen by Dr. Gwenith Spitz. Denies any discomfort. Vital signs obtained at 1035 after >5 minutes resting in chair in sitting position since 1025. Lunch ordered for patient. Kit labs drawn at 1108 using 21 gauge butterfly needle in right AC at 1108. Study drug checked??with Kathreen Devoid RN and administered subcutaneoulsy at 1120 in right lower abdomen. Lunch arrived from kitchen at 1110.

## 2018-09-03 NOTE — Unmapped (Signed)
[facility]       Clinical Trial Visit Note      I saw Karen Snow for ongoing care as part of a clinical trial. I personally interviewed and examined the patient and reviewed relevant GI/IBD history.  Full details including symptom diary can be found in the clinical trial record.   The patient will continue in the clinical trial as planned.     Additional information:Continuing weight loss (1 pound) and diarrhea. Discussion about alternative treatments. Will continue in the moment but if symptoms continue in the next 2 weeks, consider early withdrawal.   Investigator:  Trula Slade 09/03/2018.

## 2018-09-03 NOTE — Unmapped (Signed)
Vitals taken at end of observation period, see flowsheet and Epic. Injection site right lower abdomen WNL. Patient spoke with coordinator before leaving. Patient was discharged from the [facility] with no complaints voiced. Left the clinic at 1222 alert and oriented and fully ambulatory.

## 2018-09-05 NOTE — Unmapped (Signed)
Patient failing clinical trial.  Per Dr. Gwenith Spitz need to pursue stelara [facility] again. Orders sent to [facility] who may be able to assist with both IV and SQ doses with financial assistance program. Should also consider JJPAF if patient has [facility] financial assistance program.  Will need to update Quantiferon and HepBsAg.

## 2018-09-11 LAB — HEPATITIS B SURFACE ANTIGEN
HEPATITIS B SURFACE ANTIGEN: NONREACTIVE
Lab: NONREACTIVE

## 2018-09-11 LAB — QUANTIFERON TB GOLD PLUS: Lab: NEGATIVE

## 2018-09-11 NOTE — Unmapped (Signed)
PA for IV and SQ dose submitted through [facility] per [facility] request. Approval in place, [facility] notified.     IV approval; Your request has been approved   PA Case: [id number], Status: Approved, Coverage Starts on: 03/05/2018, Coverage Ends on: 03/07/2019. Questions? Contact [phone].    SQ approval; Your request has been approved   PA Case: [id number], Status: Approved, Coverage Starts on: 03/05/2018, Coverage Ends on: 03/07/2019. Questions? Contact [phone].

## 2018-09-19 ENCOUNTER — Telehealth: Payer: Self-pay | Admitting: Family Medicine

## 2018-09-19 ENCOUNTER — Other Ambulatory Visit: Payer: Self-pay

## 2018-09-19 DIAGNOSIS — Z20822 Contact with and (suspected) exposure to covid-19: Secondary | ICD-10-CM

## 2018-09-19 NOTE — Telephone Encounter (Signed)
Spoke with patient.  Advised her that an appointment is not needed at testing site.  Patient states she will go for COVID 19 test today between 8a-3:45 pm at [facility].  Testing protocol reviewed. Order placed.

## 2018-09-19 NOTE — Telephone Encounter (Signed)
COVID-19 Testing Request:  Office name - [place]  Requesting Provider - health department  Contact number [phone] Fax number - [phone] Reason for request - symptoms

## 2018-09-22 LAB — NOVEL CORONAVIRUS, NAA: SARS-CoV-2, NAA: NOT DETECTED

## 2018-09-27 NOTE — Unmapped (Signed)
Contacted [facility] multiple times to check status.  Now ready to proceed but have not been able to communicate with patient. Called and spoke with husband and gave number to call [phone] to finalize stelara start and financial assistance program.

## 2018-09-28 NOTE — Unmapped (Signed)
Patient will start stelara with [facility] on 10/02/2018. She has been approved for financial assistance program and in agreement with plan.  Dr. Gwenith Spitz updated.

## 2018-09-30 NOTE — Unmapped (Signed)
Called patient on 26JUL2020 at 14:15 to ask the following COVID screening questions:    Have you traveled within the last 14 days?     Do you have any of the following symptoms that are new or worsening: cough, shortness of breath, loss of taste/smell, sore throat, fever or feeling feverish, repeated shaking, chills, muscle pain, vomiting, or diarrhea?     Have you had close contact with a person with confirmed COVID-19 in the last 21 days before symptoms began?      Have you tested positive for COVID-19 within the last 21 days?    Patient answered no to all questions.

## 2018-10-01 ENCOUNTER — Encounter: Admit: 2018-10-01 | Discharge: 2018-10-02 | Payer: MEDICARE

## 2018-10-01 DIAGNOSIS — Z006 Encounter for examination for normal comparison and control in clinical research program: Principal | ICD-10-CM

## 2018-10-01 NOTE — Unmapped (Signed)
[facility]       Clinical Trial Visit Note      I saw Karen Snow for ongoing care as part of a clinical trial. I personally interviewed and examined the patient and reviewed relevant GI/IBD history.  Full details including symptom diary can be found in the clinical trial record.   The patient will not continue in the clinical trial as planned.     Additional information: Patient continues to be clinically  symptomatic on study drug and in a shared decision making process we decided to terminate the study participation.     Investigator:  Trula Slade 10/01/2018.

## 2018-10-03 NOTE — Unmapped (Signed)
Subject presented to the [facility] on 27JUL2020 for the [id number] study (open label Etrolizumab).  Subject was withdrawn from study due to no improvement in Crohn's Disease symptoms while in study.  Treating PI, Dr. Britt Bolognese was present for the visit.  Details of the visit can be found in the research specific chart.

## 2018-10-06 ENCOUNTER — Encounter: Admit: 2018-10-06 | Discharge: 2018-10-07 | Payer: MEDICARE | Attending: Family | Primary: Family

## 2018-10-06 DIAGNOSIS — Z1159 Encounter for screening for other viral diseases: Principal | ICD-10-CM

## 2018-10-06 NOTE — Unmapped (Signed)
COVID Pre-Procedure Intake Form     Assessment     Karen Snow is a 68 y.o. female presenting to [facility] for COVID testing.     Plan     If no testing performed, pt counseled on routine care for respiratory illness.  If testing performed, COVID sent.  Patient directed to Home given findings during today's visit.    Subjective     Karen Snow is a 68 y.o. female who presents to the [facility] with complaints of the following:    Exposure History: In the last 21 days?     Have you traveled outside of [place]? No               Have you been in close contact with someone confirmed by a test to have COVID? (Close contact is within 6 feet for at least 10 minutes) No       Have you worked in a health care facility? No     Lived or worked facility like a nursing home, group home, or assisted living?    No         Are you scheduled to have surgery or a procedure in the next 3 days? Yes               Are you scheduled to receive cancer chemotherapy within the next 7 days?    No     Have you ever been tested before for COVID-19 with a swab of your nose? Yes: When: 09/19/18 , Where: [facility]   Are you a healthcare worker being tested so to return to work No     Right now,  do you have any of the following that developed over the past 7 days (as stated by patient on intake form):    Subjective fever (felt feverish) No   Chills (especially repeated shaking chills) No   Severe fatigue (felt very tired) No   Muscle aches No   Runny nose No   Sore throat No   Loss of taste or smell No   Cough (new onset or worsening of chronic cough) No   Shortness of breath No   Nausea or vomiting No   Headache No   Abdominal Pain No   Diarrhea (3 or more loose stools in last 24 hours) No       Scribe's Attestation: Paulita Fujita, FNP obtained and performed the history, physical exam and medical decision making elements that were entered into the chart.  Signed by Alleen Borne serving as Scribe, on 10/06/2018 10:44 AM      The documentation recorded by the scribe accurately reflects the service I personally performed and the decisions made by me. Aida Puffer, FNP  October 06, 2018 10:59 AM

## 2018-10-09 ENCOUNTER — Encounter: Admit: 2018-10-09 | Discharge: 2018-10-09

## 2018-10-09 ENCOUNTER — Ambulatory Visit: Admit: 2018-10-09 | Discharge: 2018-10-09

## 2018-10-09 DIAGNOSIS — K5080 Crohn's disease of both small and large intestine without complications: Principal | ICD-10-CM

## 2018-11-26 NOTE — Unmapped (Signed)
Called patient on 21SEP2020 at 10:00 to ask the following COVID screening questions:     Have you traveled within the last 14 days?     Do you have any of the following symptoms that are new or worsening: cough, shortness of breath, loss of taste/smell, sore throat, fever or feeling feverish, repeated shaking, chills, muscle pain, vomiting, or diarrhea?     Have you had close contact with a person with confirmed COVID-19 in the last 21 days before symptoms began?      Have you tested positive for COVID-19 within the last 21 days?    Patient answered no to first three questions and stated that she tested positive for COVID on 1SEP2020.  Patient stated she has not had any symptoms of COVID since 8SEP2020.

## 2018-11-27 ENCOUNTER — Encounter: Admit: 2018-11-27 | Discharge: 2018-11-28 | Payer: MEDICARE

## 2018-11-27 DIAGNOSIS — K50018 Crohn's disease of small intestine with other complication: Secondary | ICD-10-CM

## 2018-11-27 DIAGNOSIS — Z006 Encounter for examination for normal comparison and control in clinical research program: Secondary | ICD-10-CM

## 2018-11-27 LAB — CBC W/ AUTO DIFF
BASOPHILS ABSOLUTE COUNT: 0 10*9/L (ref 0.0–0.1)
BASOPHILS RELATIVE PERCENT: 0.3 %
EOSINOPHILS ABSOLUTE COUNT: 0.2 10*9/L (ref 0.0–0.4)
EOSINOPHILS RELATIVE PERCENT: 2.1 %
HEMATOCRIT: 36.4 % (ref 36.0–46.0)
LARGE UNSTAINED CELLS: 2 % (ref 0–4)
LYMPHOCYTES ABSOLUTE COUNT: 1.5 10*9/L (ref 1.5–5.0)
LYMPHOCYTES RELATIVE PERCENT: 18.4 %
MEAN CORPUSCULAR HEMOGLOBIN CONC: 31.4 g/dL (ref 31.0–37.0)
MEAN CORPUSCULAR HEMOGLOBIN: 29.4 pg (ref 26.0–34.0)
MEAN CORPUSCULAR VOLUME: 93.6 fL (ref 80.0–100.0)
MEAN PLATELET VOLUME: 7.3 fL (ref 7.0–10.0)
MONOCYTES ABSOLUTE COUNT: 0.6 10*9/L (ref 0.2–0.8)
MONOCYTES RELATIVE PERCENT: 7.2 %
NEUTROPHILS ABSOLUTE COUNT: 5.7 10*9/L (ref 2.0–7.5)
PLATELET COUNT: 331 10*9/L (ref 150–440)
RED BLOOD CELL COUNT: 3.89 10*12/L — ABNORMAL LOW (ref 4.00–5.20)
RED CELL DISTRIBUTION WIDTH: 14.6 % (ref 12.0–15.0)
WBC ADJUSTED: 8.1 10*9/L (ref 4.5–11.0)

## 2018-11-27 LAB — COMPREHENSIVE METABOLIC PANEL
ALBUMIN: 3.8 g/dL (ref 3.5–5.0)
ALKALINE PHOSPHATASE: 92 U/L (ref 38–126)
ALT (SGPT): 15 U/L (ref <35)
ANION GAP: 7 mmol/L (ref 7–15)
AST (SGOT): 26 U/L (ref 14–38)
BILIRUBIN TOTAL: 0.5 mg/dL (ref 0.0–1.2)
BLOOD UREA NITROGEN: 8 mg/dL (ref 7–21)
BUN / CREAT RATIO: 14
CALCIUM: 9.3 mg/dL (ref 8.5–10.2)
CHLORIDE: 106 mmol/L (ref 98–107)
CO2: 28 mmol/L (ref 22.0–30.0)
EGFR CKD-EPI AA FEMALE: >90 mL/min/{1.73_m2} (ref >=60)
EGFR CKD-EPI NON-AA FEMALE: >90 mL/min/{1.73_m2} (ref >=60)
GLUCOSE RANDOM: 90 mg/dL (ref 70–179)
POTASSIUM: 4.1 mmol/L (ref 3.5–5.0)
PROTEIN TOTAL: 6.5 g/dL (ref 6.5–8.3)
SODIUM: 141 mmol/L (ref 135–145)

## 2018-11-27 LAB — HYPOCHROMIA

## 2018-11-27 LAB — IRON PANEL
IRON: 34 ug/dL — ABNORMAL LOW (ref 35–165)
TOTAL IRON BINDING CAPACITY (CALC): 314.6 mg/dL (ref 252.0–479.0)

## 2018-11-27 LAB — ALBUMIN: Albumin:MCnc:Pt:Ser/Plas:Qn:: 3.8

## 2018-11-27 LAB — TRANSFERRIN: Transferrin:MCnc:Pt:Ser/Plas:Qn:: 249.7

## 2018-11-27 LAB — FERRITIN: Ferritin:MCnc:Pt:Ser/Plas:Qn:: 43.1

## 2018-11-27 LAB — C-REACTIVE PROTEIN: C reactive protein:MCnc:Pt:Ser/Plas:Qn:: 12.2 — ABNORMAL HIGH

## 2018-11-27 NOTE — Unmapped (Signed)
Positive for virus over 2 weeks ago, finished quarantine now, has gone back to work, feeling well.  She was instructed to proceed with the stelara as scheduled.  Dr. Gwenith Spitz updated.

## 2018-11-28 LAB — VITAMIN D, TOTAL (25OH): Lab: 40.2

## 2019-01-10 ENCOUNTER — Encounter: Admit: 2019-01-10 | Discharge: 2019-01-10 | Payer: MEDICARE | Attending: Gastroenterology | Primary: Gastroenterology

## 2019-01-10 DIAGNOSIS — K50018 Crohn's disease of small intestine with other complication: Principal | ICD-10-CM

## 2019-01-10 LAB — CBC W/ AUTO DIFF
BASOPHILS RELATIVE PERCENT: 0.8 %
EOSINOPHILS ABSOLUTE COUNT: 0.3 10*9/L (ref 0.0–0.4)
EOSINOPHILS RELATIVE PERCENT: 4.1 %
HEMATOCRIT: 38.6 % (ref 36.0–46.0)
HEMOGLOBIN: 12 g/dL (ref 12.0–16.0)
LARGE UNSTAINED CELLS: 1 % (ref 0–4)
LYMPHOCYTES ABSOLUTE COUNT: 1.6 10*9/L (ref 1.5–5.0)
LYMPHOCYTES RELATIVE PERCENT: 19.8 %
MEAN CORPUSCULAR HEMOGLOBIN CONC: 31 g/dL (ref 31.0–37.0)
MEAN CORPUSCULAR HEMOGLOBIN: 29.2 pg (ref 26.0–34.0)
MEAN CORPUSCULAR VOLUME: 94.1 fL (ref 80.0–100.0)
MEAN PLATELET VOLUME: 9.3 fL (ref 7.0–10.0)
MONOCYTES ABSOLUTE COUNT: 0.5 10*9/L (ref 0.2–0.8)
MONOCYTES RELATIVE PERCENT: 6.6 %
NEUTROPHILS ABSOLUTE COUNT: 5.4 10*9/L (ref 2.0–7.5)
NEUTROPHILS RELATIVE PERCENT: 67.5 %
PLATELET COUNT: 240 10*9/L (ref 150–440)
RED BLOOD CELL COUNT: 4.11 10*12/L (ref 4.00–5.20)
RED CELL DISTRIBUTION WIDTH: 14.2 % (ref 12.0–15.0)
WBC ADJUSTED: 7.9 10*9/L (ref 4.5–11.0)

## 2019-01-10 LAB — IRON PANEL
IRON SATURATION (CALC): 12 % — ABNORMAL LOW (ref 15–50)
IRON: 42 ug/dL (ref 35–165)
TRANSFERRIN: 279.6 mg/dL (ref 200.0–380.0)

## 2019-01-10 LAB — COMPREHENSIVE METABOLIC PANEL
ALBUMIN: 3.8 g/dL (ref 3.5–5.0)
ALKALINE PHOSPHATASE: 97 U/L (ref 38–126)
ALT (SGPT): 16 U/L (ref <35)
ANION GAP: 4 mmol/L — ABNORMAL LOW (ref 7–15)
AST (SGOT): 25 U/L (ref 14–38)
BILIRUBIN TOTAL: 0.5 mg/dL (ref 0.0–1.2)
BLOOD UREA NITROGEN: 12 mg/dL (ref 7–21)
BUN / CREAT RATIO: 18
CHLORIDE: 107 mmol/L (ref 98–107)
CO2: 30 mmol/L (ref 22.0–30.0)
CREATININE: 0.66 mg/dL (ref 0.60–1.00)
EGFR CKD-EPI AA FEMALE: >90 mL/min/{1.73_m2} (ref >=60)
EGFR CKD-EPI NON-AA FEMALE: >90 mL/min/{1.73_m2} (ref >=60)
GLUCOSE RANDOM: 75 mg/dL (ref 70–179)
PROTEIN TOTAL: 6.7 g/dL (ref 6.5–8.3)
SODIUM: 141 mmol/L (ref 135–145)

## 2019-01-10 LAB — TRANSFERRIN: Transferrin:MCnc:Pt:Ser/Plas:Qn:: 279.6

## 2019-01-10 LAB — FOLATE: Folate:MCnc:Pt:Ser/Plas:Qn:: >20 — ABNORMAL HIGH

## 2019-01-10 LAB — LYMPHOCYTES ABSOLUTE COUNT: Lymphocytes:NCnc:Pt:Bld:Qn:Automated count: 1.6

## 2019-01-10 LAB — C-REACTIVE PROTEIN: C reactive protein:MCnc:Pt:Ser/Plas:Qn:: 6.1

## 2019-01-10 LAB — VITAMIN B-12: Cobalamins:MCnc:Pt:Ser/Plas:Qn:: 204

## 2019-01-10 LAB — CO2: Carbon dioxide:SCnc:Pt:Ser/Plas:Qn:: 30

## 2019-01-10 LAB — FERRITIN: Ferritin:MCnc:Pt:Ser/Plas:Qn:: 26.3

## 2019-01-10 NOTE — Unmapped (Addendum)
Continue Stelara    Lab check today    Flu shot today

## 2019-01-10 NOTE — Unmapped (Signed)
Gastroenterology Return Visit Note      HISTORY OF PRESENT ILLNESS: This is a 68 y.o. year old female with a history of Crohn's disease as outlined below.  Currently she is doing reasonable well.  Her weight is stable.  She has about 4 bowel movements a day which is her normal.  No significant abdominal pain.  She just received her's first ustekinumab injection in stool for the next in approximately 2 weeks.  No excellent test manifestation of inflammatory bowel disease.    PAST MEDICAL HISTORY:    Past Medical History:   Diagnosis Date   ??? Crohn's disease (CMS-HCC)    ??? Disease of thyroid gland    ??? Hypertension        PAST SURGICAL HISTORY:    Past Surgical History:   Procedure Laterality Date   ??? ABDOMINAL SURGERY      colon resection   ??? BUNIONECTOMY Left     with pins   ??? COLON SURGERY      resection   ??? FOOT SURGERY Left     bunionectomy; pins placed   ??? left foot surgery     ??? PR COLONOSCOPY FLX DX W/COLLJ SPEC WHEN PFRMD N/A 08/03/2012    Procedure: COLONOSCOPY, FLEXIBLE, PROXIMAL TO SPLENIC FLEXURE; DIAGNOSTIC, W/WO COLLECTION SPECIMEN BY BRUSH OR WASH;  Surgeon: Trula Slade, MD;  Location: [facility];  Service: Gastroenterology   ??? PR COLONOSCOPY FLX DX W/COLLJ SPEC WHEN PFRMD N/A 10/24/2014    Procedure: COLONOSCOPY, FLEXIBLE, PROXIMAL TO SPLENIC FLEXURE; DIAGNOSTIC, W/WO COLLECTION SPECIMEN BY BRUSH OR WASH;  Surgeon: Trula Slade, MD;  Location: [facility];  Service: Gastroenterology   ??? PR COLONOSCOPY FLX DX W/COLLJ SPEC WHEN PFRMD N/A 04/08/2016    Procedure: COLONOSCOPY, FLEXIBLE, PROXIMAL TO SPLENIC FLEXURE; DIAGNOSTIC, W/WO COLLECTION SPECIMEN BY BRUSH OR WASH;  Surgeon: Modena Nunnery, MD;  Location: [facility];  Service: Gastroenterology   ??? PR COLONOSCOPY FLX DX W/COLLJ SPEC WHEN PFRMD N/A 12/27/2017    Procedure: COLONOSCOPY, FLEXIBLE, PROXIMAL TO SPLENIC FLEXURE; DIAGNOSTIC, W/WO COLLECTION SPECIMEN BY BRUSH OR WASH;  Surgeon: Rona Ravens, MD;  Location: [facility];  Service: Gastroenterology   ??? PR COLONOSCOPY FLX DX W/COLLJ SPEC WHEN PFRMD N/A 05/25/2018    Procedure: COLONOSCOPY, FLEXIBLE, PROXIMAL TO SPLENIC FLEXURE; DIAGNOSTIC, W/WO COLLECTION SPECIMEN BY BRUSH OR WASH;  Surgeon: Modena Nunnery, MD;  Location: [facility];  Service: Gastroenterology   ??? PR COLONOSCOPY FLX DX W/COLLJ SPEC WHEN PFRMD N/A 10/09/2018    Procedure: COLONOSCOPY, FLEXIBLE, PROXIMAL TO SPLENIC FLEXURE; DIAGNOSTIC, W/WO COLLECTION SPECIMEN BY BRUSH OR WASH;  Surgeon: Rona Ravens, MD;  Location: [facility];  Service: Gastroenterology   ??? PR COLONOSCOPY W/BIOPSY SINGLE/MULTIPLE N/A 01/26/2018    Procedure: COLONOSCOPY, FLEXIBLE, PROXIMAL TO SPLENIC FLEXURE; WITH BIOPSY, SINGLE OR MULTIPLE;  Surgeon: Modena Nunnery, MD;  Location: [facility];  Service: Gastroenterology         CURRENT MEDICATIONS:      Current Outpatient Medications:   ???  calcium-vitamin D (CALCIUM-VITAMIN D) 500 mg(1,250mg ) -200 unit per tablet, Take 1 tablet by mouth daily. , Disp: , Rfl:   ???  levothyroxine (SYNTHROID) 25 MCG tablet, Take 25 mcg by mouth daily at 0600. , Disp: , Rfl:   ???  metoprolol succinate (TOPROL-XL) 50 MG 24 hr tablet, Take 50 mg by mouth daily., Disp: , Rfl:   ???  multivitamin capsule, Take 1 capsule by mouth daily., Disp: , Rfl:        VITAL SIGNS:  BP Readings from Last 4 Encounters:   11/27/18 160/67   10/09/18 109/59   10/01/18 115/64   09/03/18 131/62      Wt Readings from Last 4 Encounters:   11/27/18 51.1 kg (112 lb 9.6 oz)   10/09/18 50.8 kg (112 lb)   10/01/18 51 kg (112 lb 8 oz)   09/03/18 51.3 kg (113 lb)        BMI: Estimated body mass index is 18.74 kg/m?? as calculated from the following:    Height as of 10/09/18: 165.1 cm (5' 5).    Weight as of 11/27/18: 51.1 kg (112 lb 9.6 oz).    BSA: Estimated body surface area is 1.53 meters squared as calculated from the following:    Height as of 10/09/18: 165.1 cm (5' 5).    Weight as of 11/27/18: 51.1 kg (112 lb 9.6 oz).      PHYSICAL EXAM:    Constitutional:   Alert, oriented x 3, no acute distress, well nourished, and well hydrated.   Mental Status:   Thought organized, appropriate affect, pleasantly interactive, not anxious appearing.   HEENT:   PEERL, conjunctiva clear, anicteric, oropharynx clear, neck supple, no LAD.   Respiratory: Clear to auscultation, unlabored breathing.     Cardiac: Euvolemic, regular rate and rhythm, normal S1 and S2, no murmur.     Abdomen: Soft, normal bowel sounds, non-distended, non-tender, no organomegaly or masses.     Perianal/Rectal Exam Not performed.     Extremities:   No edema, well perfused.   Musculoskeletal: No joint swelling or tenderness noted, no deformities.     Skin: No rashes, jaundice or skin lesions noted.     Neuro: No focal deficits.          ASSESSMENT:    1. Crohns disease with Hx of small bowel resection due to stricturing and fistulizing luminal disease in 2008 and recurrence at anastomosis in 02/2010. Start of Humira in 04/2010       Course of Disease:  First diagnosis of Crohn's disease in approximately the late 1980s. At that time, she had cramping abdominal pain and diarrhea. She was started on a steroid taper and maintained on sulfasalazine. Remission until approximately 2004. In 2004 start of abdominal bloating, epigastric pain, and intermittent loose stools. 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 Start of ciprofloxacin therapy approximately in 2005 as well as Entocort and of Pentasa 4 g daily. Approximately in the beginning of 2007 decrease of entocort to 6 mg per day, however, still abdominal pain, bloating. 05/08/06 Ileocecal resection with ileostomy and repair of ileal fistula as well as takedown and repair of ileal sigmoid fistula with sigmoid colectomy (About 18 inches of small bowel and 4 inches of cecum/colon were resected). 06/22/06,ileostomy takedown with ileal colonic anastomosis. 11/2006 remission. Intolerance of 6-MP therapy (joint pain, hair loss, nausea).12/08Start of sulfasalazine due to joint pain. 5/09-10/2009 remission. 04/2010, start of Humira. 09/2010 -02/2012 clinical remission. 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  10/2017- 09/2018 participating in etrolizumab trial without significant success 10/2018 start ustekinumab      Montreal Classification: [id number]     Crohn's disease: Overall stable with mild symptoms of increased bowel frequency.  Patient is not really able to tolerate Imodium on a daily basis due to headaches.  However her decrease in CRP indicates that Stelara might be working.  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  Recommendation to start oral daily substitution.  Low B12 could be also due to bacterial overgrowth but the patient denies significant bloating.    Bone health: Vitamin D pending    Health maintenance: Prevnar 2015 and 2016. Pneumovax 2016.  Shingrix completed.     Portions of this record have been created using Scientist, clinical (histocompatibility and immunogenetics). Dictation errors have been sought, but may not have been identified and corrected.    PLAN:  Patient Instructions   Continue Stelara    Lab check today    Flu shot today DIAGNOSTIC STUDIES:  I have reviewed all pertinent diagnostic studies, including:    Colonoscopy 10/2018  Patent end-to-side ileo-colonic anastomosis,                        characterized by healthy appearing mucosa.                       - Multiple ulcers in the terminal ileum.                       - Simple Endoscopic Score for Crohn's Disease: 8,                        mucosal inflammatory changes secondary to Crohn's                        disease with ileitis.                       - The rectum, splenic flexure and transverse colon are                        normal.                       - Diverticulosis in the sigmoid colon and in the                        descending colon.        Laboratory results    All Labs  Office Visit on 01/10/2019   Component Date Value Ref Range Status   ??? CRP 01/10/2019 6.1  <10.0 mg/L Final   ??? Ferritin 01/10/2019 26.3  3.0 - 151.0 ng/mL Final   ??? Vitamin B-12 01/10/2019 204  193 - 900 pg/ml Final   ??? Folate 01/10/2019 >20.0* 2.7 - 20.0 ng/mL Final   ??? Sodium 01/10/2019 141  135 - 145 mmol/L Final   ??? Potassium 01/10/2019 4.0  3.5 - 5.0 mmol/L Final   ??? Chloride 01/10/2019 107  98 - 107 mmol/L Final   ???  Anion Gap 01/10/2019 4* 7 - 15 mmol/L Final   ??? CO2 01/10/2019 30.0  22.0 - 30.0 mmol/L Final   ??? BUN 01/10/2019 12  7 - 21 mg/dL Final   ??? Creatinine 01/10/2019 0.66  0.60 - 1.00 mg/dL Final   ??? BUN/Creatinine Ratio 01/10/2019 18   Final   ??? EGFR CKD-EPI Non-African American,* 01/10/2019 >90  >=60 mL/min/1.12m2 Final   ??? EGFR CKD-EPI African American, Fem* 01/10/2019 >90  >=60 mL/min/1.57m2 Final   ??? Glucose 01/10/2019 75  70 - 179 mg/dL Final   ??? Calcium 16/12/9602 9.1  8.5 - 10.2 mg/dL Final   ??? Albumin 54/11/8117 3.8  3.5 - 5.0 g/dL Final   ??? Total Protein 01/10/2019 6.7  6.5 - 8.3 g/dL Final   ??? Total Bilirubin 01/10/2019 0.5  0.0 - 1.2 mg/dL Final   ??? AST 14/78/2956 25  14 - 38 U/L Final   ??? ALT 01/10/2019 16  <35 U/L Final   ??? Alkaline Phosphatase 01/10/2019 97  38 - 126 U/L Final   ??? Iron 01/10/2019 42  35 - 165 ug/dL Final   ??? TIBC 21/30/8657 352.3  252.0 - 479.0 mg/dL Final   ??? Transferrin 01/10/2019 279.6  200.0 - 380.0 mg/dL Final   ??? Iron Saturation (%) 01/10/2019 12* 15 - 50 % Final   ??? WBC 01/10/2019 7.9  4.5 - 11.0 10*9/L Final   ??? RBC 01/10/2019 4.11  4.00 - 5.20 10*12/L Final   ??? HGB 01/10/2019 12.0  12.0 - 16.0 g/dL Final   ??? HCT 84/69/6295 38.6  36.0 - 46.0 % Final   ??? MCV 01/10/2019 94.1  80.0 - 100.0 fL Final   ??? MCH 01/10/2019 29.2  26.0 - 34.0 pg Final   ??? MCHC 01/10/2019 31.0  31.0 - 37.0 g/dL Final   ??? RDW 28/41/3244 14.2  12.0 - 15.0 % Final   ??? MPV 01/10/2019 9.3  7.0 - 10.0 fL Final   ??? Platelet 01/10/2019 240  150 - 440 10*9/L Final   ??? Neutrophils % 01/10/2019 67.5  % Final   ??? Lymphocytes % 01/10/2019 19.8  % Final   ??? Monocytes % 01/10/2019 6.6  % Final   ??? Eosinophils % 01/10/2019 4.1  % Final   ??? Basophils % 01/10/2019 0.8  % Final   ??? Absolute Neutrophils 01/10/2019 5.4  2.0 - 7.5 10*9/L Final   ??? Absolute Lymphocytes 01/10/2019 1.6  1.5 - 5.0 10*9/L Final   ??? Absolute Monocytes 01/10/2019 0.5  0.2 - 0.8 10*9/L Final   ??? Absolute Eosinophils 01/10/2019 0.3  0.0 - 0.4 10*9/L Final   ??? Absolute Basophils 01/10/2019 0.1  0.0 - 0.1 10*9/L Final   ??? Large Unstained Cells 01/10/2019 1  0 - 4 % Final   ??? Hypochromasia 01/10/2019 Moderate* Not Present Final

## 2019-01-11 NOTE — Unmapped (Signed)
Labs DOS 01/10/2019 reviewed with Dr. Gwenith Spitz and patient. Patient instructed to start Vitamin B12 supplementation now; 1030mcg-2500mcg ONCE DAILY in the SUBLINGUAL form. Explained need to have SL administration for adequate absorption and this would need to be lifelong supplement.  Patient verbalized understanding and in agreement with plan.

## 2019-01-14 LAB — VITAMIN D, TOTAL (25OH): Lab: 35.8

## 2019-02-18 MED ORDER — DIPHENOXYLATE-ATROPINE 2.5 MG-0.025 MG TABLET
ORAL_TABLET | Freq: Four times a day (QID) | ORAL | 0 refills | 8.00000 days | Status: CP | PRN
Start: 2019-02-18 — End: ?

## 2019-02-18 NOTE — Unmapped (Signed)
Patient reports no problems , all questionnaire  Items asked . Still diarrhea every time she eats a larger ,eal, proposal to try lomotil , imodium gives her headaches and gas.     Next Appointment Mid January

## 2019-03-20 NOTE — Unmapped (Signed)
PA renewal needed for stelara 45mg  vials given through [facility].  Submitted with [facility] today through [facility].

## 2019-03-26 NOTE — Unmapped (Deleted)
Gastroenterology Return Visit Note      HISTORY OF PRESENT ILLNESS: This is a 69 y.o. year old female with a history of Crohn's disease as outlined below.  Currently she is doing reasonable well.  Her weight is stable.  She has about 4 bowel movements a day which is her normal.  No significant abdominal pain.  She just received her's first ustekinumab injection in stool for the next in approximately 2 weeks.  No excellent test manifestation of inflammatory bowel disease.    PAST MEDICAL HISTORY:    Past Medical History:   Diagnosis Date   ??? Crohn's disease (CMS-HCC)    ??? Disease of thyroid gland    ??? Hypertension        PAST SURGICAL HISTORY:    Past Surgical History:   Procedure Laterality Date   ??? ABDOMINAL SURGERY      colon resection   ??? BUNIONECTOMY Left     with pins   ??? COLON SURGERY      resection   ??? FOOT SURGERY Left     bunionectomy; pins placed   ??? left foot surgery     ??? PR COLONOSCOPY FLX DX W/COLLJ SPEC WHEN PFRMD N/A 08/03/2012    Procedure: COLONOSCOPY, FLEXIBLE, PROXIMAL TO SPLENIC FLEXURE; DIAGNOSTIC, W/WO COLLECTION SPECIMEN BY BRUSH OR WASH;  Surgeon: Trula Slade, MD;  Location: [facility];  Service: Gastroenterology   ??? PR COLONOSCOPY FLX DX W/COLLJ SPEC WHEN PFRMD N/A 10/24/2014    Procedure: COLONOSCOPY, FLEXIBLE, PROXIMAL TO SPLENIC FLEXURE; DIAGNOSTIC, W/WO COLLECTION SPECIMEN BY BRUSH OR WASH;  Surgeon: Trula Slade, MD;  Location: [facility];  Service: Gastroenterology   ??? PR COLONOSCOPY FLX DX W/COLLJ SPEC WHEN PFRMD N/A 04/08/2016    Procedure: COLONOSCOPY, FLEXIBLE, PROXIMAL TO SPLENIC FLEXURE; DIAGNOSTIC, W/WO COLLECTION SPECIMEN BY BRUSH OR WASH;  Surgeon: Modena Nunnery, MD;  Location: [facility];  Service: Gastroenterology   ??? PR COLONOSCOPY FLX DX W/COLLJ SPEC WHEN PFRMD N/A 12/27/2017    Procedure: COLONOSCOPY, FLEXIBLE, PROXIMAL TO SPLENIC FLEXURE; DIAGNOSTIC, W/WO COLLECTION SPECIMEN BY BRUSH OR WASH;  Surgeon: Rona Ravens, MD;  Location: [facility];  Service: Gastroenterology   ??? PR COLONOSCOPY FLX DX W/COLLJ SPEC WHEN PFRMD N/A 05/25/2018    Procedure: COLONOSCOPY, FLEXIBLE, PROXIMAL TO SPLENIC FLEXURE; DIAGNOSTIC, W/WO COLLECTION SPECIMEN BY BRUSH OR WASH;  Surgeon: Modena Nunnery, MD;  Location: [facility];  Service: Gastroenterology   ??? PR COLONOSCOPY FLX DX W/COLLJ SPEC WHEN PFRMD N/A 10/09/2018    Procedure: COLONOSCOPY, FLEXIBLE, PROXIMAL TO SPLENIC FLEXURE; DIAGNOSTIC, W/WO COLLECTION SPECIMEN BY BRUSH OR WASH;  Surgeon: Rona Ravens, MD;  Location: [facility];  Service: Gastroenterology   ??? PR COLONOSCOPY W/BIOPSY SINGLE/MULTIPLE N/A 01/26/2018    Procedure: COLONOSCOPY, FLEXIBLE, PROXIMAL TO SPLENIC FLEXURE; WITH BIOPSY, SINGLE OR MULTIPLE;  Surgeon: Modena Nunnery, MD;  Location: [facility];  Service: Gastroenterology         CURRENT MEDICATIONS:      Current Outpatient Medications:   ???  calcium-vitamin D (CALCIUM-VITAMIN D) 500 mg(1,250mg ) -200 unit per tablet, Take 1 tablet by mouth daily. , Disp: , Rfl:   ???  diphenoxylate-atropine (LOMOTIL) 2.5-0.025 mg per tablet, Take 1 tablet by mouth 4 (four) times a day as needed for diarrhea for up to 30 doses., Disp: 30 tablet, Rfl: 0  ???  levothyroxine (SYNTHROID) 25 MCG tablet, Take 25 mcg by  mouth daily at 0600. , Disp: , Rfl:   ???  metoprolol succinate (TOPROL-XL) 50 MG 24 hr tablet, Take 50 mg by mouth daily., Disp: , Rfl:   ???  multivitamin capsule, Take 1 capsule by mouth daily., Disp: , Rfl:        VITAL SIGNS:  BP Readings from Last 4 Encounters:   11/27/18 160/67   10/09/18 109/59   10/01/18 115/64   09/03/18 131/62      Wt Readings from Last 4 Encounters:   11/27/18 51.1 kg (112 lb 9.6 oz)   10/09/18 50.8 kg (112 lb)   10/01/18 51 kg (112 lb 8 oz)   09/03/18 51.3 kg (113 lb)        BMI: Estimated body mass index is 18.74 kg/m?? as calculated from the following: Height as of 10/09/18: 165.1 cm (5' 5).    Weight as of 11/27/18: 51.1 kg (112 lb 9.6 oz).    BSA: Estimated body surface area is 1.53 meters squared as calculated from the following:    Height as of 10/09/18: 165.1 cm (5' 5).    Weight as of 11/27/18: 51.1 kg (112 lb 9.6 oz).      PHYSICAL EXAM:    Constitutional:   Alert, oriented x 3, no acute distress, well nourished, and well hydrated.   Mental Status:   Thought organized, appropriate affect, pleasantly interactive, not anxious appearing.   HEENT:   PEERL, conjunctiva clear, anicteric, oropharynx clear, neck supple, no LAD.   Respiratory: Clear to auscultation, unlabored breathing.     Cardiac: Euvolemic, regular rate and rhythm, normal S1 and S2, no murmur.     Abdomen: Soft, normal bowel sounds, non-distended, non-tender, no organomegaly or masses.     Perianal/Rectal Exam Not performed.     Extremities:   No edema, well perfused.   Musculoskeletal: No joint swelling or tenderness noted, no deformities.     Skin: No rashes, jaundice or skin lesions noted.     Neuro: No focal deficits.          ASSESSMENT:    1. Crohns disease with Hx of small bowel resection due to stricturing and fistulizing luminal disease in 2008 and recurrence at anastomosis in 02/2010. Start of Humira in 04/2010       Course of Disease:  First diagnosis of Crohn's disease in approximately the late 1980s. At that time, she had cramping abdominal pain and diarrhea. She was started on a steroid taper and maintained on sulfasalazine. Remission until approximately 2004. In 2004 start of abdominal bloating, epigastric pain, and intermittent loose stools. 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 Start of ciprofloxacin therapy approximately in 2005 as well as Entocort and of Pentasa 4 g daily. Approximately in the beginning of 2007 decrease of entocort to 6 mg per day, however, still abdominal pain, bloating. 05/08/06 Ileocecal resection with ileostomy and repair of ileal fistula as well as takedown and repair of ileal sigmoid fistula with sigmoid colectomy (About 18 inches of small bowel and 4 inches of cecum/colon were resected). 06/22/06,ileostomy takedown with ileal colonic anastomosis. 11/2006 remission. Intolerance of 6-MP therapy (joint pain, hair loss, nausea).12/08Start of sulfasalazine due to joint pain. 5/09-10/2009 remission. 04/2010, start of Humira. 09/2010 -02/2012 clinical remission. 03/2014 mild symptoms in the settings of missing a dose of Humira 07/2014 remission but another interruption of Humira therapy for 4 weeks due to foot surgery 01/2015 increase to weekly Humira therapy in the setting  of I3 recurrence 01/2017 continues weight loss on weekly Humira therapy 02/2017 CT showing several inflammatory small bowel segments 03/2017 start of prednisone taper , improvement 09/2017 after end of steroid taper recurrence of diarrhea despite weekly Humira, weight loss and mild abdominal pain.  10/2017- 09/2018 participating in etrolizumab trial without significant success 10/2018 start ustekinumab      Montreal Classification: [id number]     Crohn's disease: Overall stable with mild symptoms of increased bowel frequency.  Patient is not really able to tolerate Imodium on a daily basis due to headaches.  However her decrease in CRP indicates that Stelara might be working.  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  Recommendation to start oral daily substitution.  Low B12 could be also due to bacterial overgrowth but the patient denies significant bloating.    Bone health: Vitamin D pending    Health maintenance: Prevnar 2015 and 2016. Pneumovax 2016.  Shingrix completed.     Portions of this record have been created using Scientist, clinical (histocompatibility and immunogenetics). Dictation errors have been sought, but may not have been identified and corrected.    PLAN:  There are no Patient Instructions on file for this visit.      DIAGNOSTIC STUDIES:  I have reviewed all pertinent diagnostic studies, including:    Colonoscopy 10/2018  Patent end-to-side ileo-colonic anastomosis,                        characterized by healthy appearing mucosa.                       - Multiple ulcers in the terminal ileum.                       - Simple Endoscopic Score for Crohn's Disease: 8,                        mucosal inflammatory changes secondary to Crohn's                        disease with ileitis.                       - The rectum, splenic flexure and transverse colon are                        normal.                       - Diverticulosis in the sigmoid colon and in the                        descending colon.        Laboratory results    All Labs  No visits with results within 1 Week(s) from this visit.   Latest known visit with results is:   Office Visit on 01/10/2019   Component Date Value Ref Range Status   ??? CRP 01/10/2019 6.1  <10.0 mg/L Final   ??? Ferritin 01/10/2019 26.3  3.0 - 151.0 ng/mL Final   ??? Vitamin D Total (25OH) 01/10/2019 35.8  20.0 - 80.0 ng/mL Final   ???  Vitamin B-12 01/10/2019 204  193 - 900 pg/ml Final   ??? Folate 01/10/2019 >20.0* 2.7 - 20.0 ng/mL Final   ??? Sodium 01/10/2019 141  135 - 145 mmol/L Final   ??? Potassium 01/10/2019 4.0  3.5 - 5.0 mmol/L Final   ??? Chloride 01/10/2019 107  98 - 107 mmol/L Final   ??? Anion Gap 01/10/2019 4* 7 - 15 mmol/L Final   ??? CO2 01/10/2019 30.0  22.0 - 30.0 mmol/L Final   ??? BUN 01/10/2019 12  7 - 21 mg/dL Final   ??? Creatinine 01/10/2019 0.66  0.60 - 1.00 mg/dL Final   ??? BUN/Creatinine Ratio 01/10/2019 18   Final   ??? EGFR CKD-EPI Non-African American,* 01/10/2019 >90  >=60 mL/min/1.24m2 Final   ??? EGFR CKD-EPI African American, Fem* 01/10/2019 >90  >=60 mL/min/1.36m2 Final   ??? Glucose 01/10/2019 75  70 - 179 mg/dL Final   ??? Calcium 16/12/9602 9.1  8.5 - 10.2 mg/dL Final   ??? Albumin 54/11/8117 3.8  3.5 - 5.0 g/dL Final   ??? Total Protein 01/10/2019 6.7  6.5 - 8.3 g/dL Final   ??? Total Bilirubin 01/10/2019 0.5  0.0 - 1.2 mg/dL Final   ??? AST 14/78/2956 25  14 - 38 U/L Final   ??? ALT 01/10/2019 16  <35 U/L Final   ??? Alkaline Phosphatase 01/10/2019 97  38 - 126 U/L Final   ??? Iron 01/10/2019 42  35 - 165 ug/dL Final   ??? TIBC 21/30/8657 352.3  252.0 - 479.0 mg/dL Final   ??? Transferrin 01/10/2019 279.6  200.0 - 380.0 mg/dL Final   ??? Iron Saturation (%) 01/10/2019 12* 15 - 50 % Final   ??? WBC 01/10/2019 7.9  4.5 - 11.0 10*9/L Final   ??? RBC 01/10/2019 4.11  4.00 - 5.20 10*12/L Final   ??? HGB 01/10/2019 12.0  12.0 - 16.0 g/dL Final   ??? HCT 84/69/6295 38.6  36.0 - 46.0 % Final   ??? MCV 01/10/2019 94.1  80.0 - 100.0 fL Final   ??? MCH 01/10/2019 29.2  26.0 - 34.0 pg Final   ??? MCHC 01/10/2019 31.0  31.0 - 37.0 g/dL Final   ??? RDW 28/41/3244 14.2  12.0 - 15.0 % Final   ??? MPV 01/10/2019 9.3  7.0 - 10.0 fL Final   ??? Platelet 01/10/2019 240  150 - 440 10*9/L Final   ??? Neutrophils % 01/10/2019 67.5  % Final   ??? Lymphocytes % 01/10/2019 19.8  % Final   ??? Monocytes % 01/10/2019 6.6  % Final   ??? Eosinophils % 01/10/2019 4.1  % Final   ??? Basophils % 01/10/2019 0.8  % Final   ??? Absolute Neutrophils 01/10/2019 5.4  2.0 - 7.5 10*9/L Final   ??? Absolute Lymphocytes 01/10/2019 1.6  1.5 - 5.0 10*9/L Final   ??? Absolute Monocytes 01/10/2019 0.5  0.2 - 0.8 10*9/L Final   ??? Absolute Eosinophils 01/10/2019 0.3  0.0 - 0.4 10*9/L Final   ??? Absolute Basophils 01/10/2019 0.1  0.0 - 0.1 10*9/L Final   ??? Large Unstained Cells 01/10/2019 1  0 - 4 % Final   ??? Hypochromasia 01/10/2019 Moderate* Not Present Final

## 2019-05-14 ENCOUNTER — Ambulatory Visit
Admission: RE | Admit: 2019-05-14 | Discharge: 2019-05-14 | Disposition: A | Discharge status: Home / Self Care | Payer: Medicare Other | Source: Ambulatory Visit | Attending: Student | Admitting: Student

## 2019-05-14 ENCOUNTER — Other Ambulatory Visit: Payer: Self-pay

## 2019-05-14 ENCOUNTER — Other Ambulatory Visit: Payer: Self-pay | Admitting: Student

## 2019-05-14 DIAGNOSIS — M8448XA Pathological fracture, other site, initial encounter for fracture: Secondary | ICD-10-CM | POA: Diagnosis present

## 2019-05-16 ENCOUNTER — Ambulatory Visit: Admit: 2019-05-16 | Discharge: 2019-05-17 | Payer: MEDICARE | Attending: Gastroenterology | Primary: Gastroenterology

## 2019-05-16 DIAGNOSIS — E039 Hypothyroidism, unspecified: Principal | ICD-10-CM

## 2019-05-16 DIAGNOSIS — E538 Deficiency of other specified B group vitamins: Principal | ICD-10-CM

## 2019-05-16 DIAGNOSIS — K50018 Crohn's disease of small intestine with other complication: Principal | ICD-10-CM

## 2019-05-16 DIAGNOSIS — E611 Iron deficiency: Principal | ICD-10-CM

## 2019-05-16 DIAGNOSIS — Z79899 Other long term (current) drug therapy: Principal | ICD-10-CM

## 2019-05-16 LAB — FERRITIN: Ferritin:MCnc:Pt:Ser/Plas:Qn:: 44.2

## 2019-05-16 LAB — COMPREHENSIVE METABOLIC PANEL
ALBUMIN: 4.2 g/dL (ref 3.5–5.0)
ALKALINE PHOSPHATASE: 87 U/L (ref 38–126)
ALT (SGPT): 17 U/L (ref <35)
ANION GAP: 9 mmol/L (ref 7–15)
AST (SGOT): 24 U/L (ref 14–38)
BILIRUBIN TOTAL: 0.4 mg/dL (ref 0.0–1.2)
BLOOD UREA NITROGEN: 17 mg/dL (ref 7–21)
BUN / CREAT RATIO: 29
CALCIUM: 10.2 mg/dL (ref 8.5–10.2)
CHLORIDE: 109 mmol/L — ABNORMAL HIGH (ref 98–107)
CO2: 27 mmol/L (ref 22.0–30.0)
EGFR CKD-EPI AA FEMALE: >90 mL/min/{1.73_m2} (ref >=60)
EGFR CKD-EPI NON-AA FEMALE: >90 mL/min/{1.73_m2} (ref >=60)
GLUCOSE RANDOM: 108 mg/dL (ref 70–179)
PROTEIN TOTAL: 7.2 g/dL (ref 6.5–8.3)
SODIUM: 145 mmol/L (ref 135–145)

## 2019-05-16 LAB — GLUCOSE RANDOM: Glucose:MCnc:Pt:Ser/Plas:Qn:: 108

## 2019-05-16 LAB — CBC W/ AUTO DIFF
BASOPHILS ABSOLUTE COUNT: 0 10*9/L (ref 0.0–0.1)
BASOPHILS RELATIVE PERCENT: 0.1 %
EOSINOPHILS ABSOLUTE COUNT: 0 10*9/L (ref 0.0–0.4)
EOSINOPHILS RELATIVE PERCENT: 0 %
HEMATOCRIT: 37.2 % (ref 36.0–46.0)
HEMOGLOBIN: 12.3 g/dL (ref 12.0–16.0)
LYMPHOCYTES RELATIVE PERCENT: 8.8 %
MEAN CORPUSCULAR HEMOGLOBIN CONC: 33.1 g/dL (ref 31.0–37.0)
MEAN CORPUSCULAR HEMOGLOBIN: 30.9 pg (ref 26.0–34.0)
MEAN CORPUSCULAR VOLUME: 93.5 fL (ref 80.0–100.0)
MEAN PLATELET VOLUME: 8.1 fL (ref 7.0–10.0)
MONOCYTES RELATIVE PERCENT: 3.5 %
NEUTROPHILS ABSOLUTE COUNT: 10.4 10*9/L — ABNORMAL HIGH (ref 2.0–7.5)
NEUTROPHILS RELATIVE PERCENT: 87 %
PLATELET COUNT: 263 10*9/L (ref 150–440)
RED BLOOD CELL COUNT: 3.98 10*12/L — ABNORMAL LOW (ref 4.00–5.20)
RED CELL DISTRIBUTION WIDTH: 14.3 % (ref 12.0–15.0)
WBC ADJUSTED: 11.9 10*9/L — ABNORMAL HIGH (ref 4.5–11.0)

## 2019-05-16 LAB — WBC ADJUSTED: Leukocytes:NCnc:Pt:Bld:Qn:: 11.9 — ABNORMAL HIGH

## 2019-05-16 LAB — IRON PANEL: TOTAL IRON BINDING CAPACITY (CALC): 394 mg/dL (ref 252.0–479.0)

## 2019-05-16 LAB — VITAMIN B12: VITAMIN B-12: >1000 pg/mL — ABNORMAL HIGH (ref 193–900)

## 2019-05-16 LAB — VITAMIN B-12: Cobalamins:MCnc:Pt:Ser/Plas:Qn:: >1000 — ABNORMAL HIGH

## 2019-05-16 LAB — THYROID STIMULATING HORMONE: Thyrotropin:ACnc:Pt:Ser/Plas:Qn:: 0.068 — ABNORMAL LOW

## 2019-05-16 LAB — C-REACTIVE PROTEIN: C reactive protein:MCnc:Pt:Ser/Plas:Qn:: <5

## 2019-05-16 LAB — FREE T4: Thyroxine.free:MCnc:Pt:Ser/Plas:Qn:: 1.35

## 2019-05-16 LAB — T3 TOTAL: Triiodothyronine:MCnc:Pt:Ser/Plas:Qn:: 1

## 2019-05-16 LAB — IRON SATURATION (CALC): Iron saturation:MFr:Pt:Ser/Plas:Qn:: 28

## 2019-05-16 MED ORDER — COLESTIPOL 1 GRAM TABLET
ORAL_TABLET | 11 refills | 0 days | Status: CP
Start: 2019-05-16 — End: ?

## 2019-05-16 MED ORDER — PREDNISONE 10 MG TABLET
ORAL_TABLET | 0 refills | 0 days | Status: CP
Start: 2019-05-16 — End: ?

## 2019-05-16 NOTE — Unmapped (Addendum)
Try Colestid 2 tablets first thing in morning before eating and drinking. Wait 2 hours before you take the other medications.    Start prednisone taper after 2nd Covid shot (can start 3 days later)    Take lomotil 1 tablet as needed    Continue Stelara in the moment q 8 weeks    Labs today

## 2019-05-16 NOTE — Unmapped (Signed)
Gastroenterology Return Visit Note      HISTORY OF PRESENT ILLNESS: This is a 69 y.o. year old female with a history of Crohn's disease as outlined below.  Since her husband died in 04-15-22 she is not doing well.  She lost additional 3 pounds of weight and still has about 4 bowel movements daily.  No blood mixed in the bowel movements, no significant abdominal pain.  Overall she is not sure if the [facility] is really working for her.  She also had severe back pain and got steroid injections in her hip and in the back recently.  PAST MEDICAL HISTORY:    Past Medical History:   Diagnosis Date   ??? Crohn's disease (CMS-HCC)    ??? Disease of thyroid gland    ??? Hypertension        PAST SURGICAL HISTORY:    Past Surgical History:   Procedure Laterality Date   ??? ABDOMINAL SURGERY      colon resection   ??? BUNIONECTOMY Left     with pins   ??? COLON SURGERY      resection   ??? FOOT SURGERY Left     bunionectomy; pins placed   ??? left foot surgery     ??? PR COLONOSCOPY FLX DX W/COLLJ SPEC WHEN PFRMD N/A 08/03/2012    Procedure: COLONOSCOPY, FLEXIBLE, PROXIMAL TO SPLENIC FLEXURE; DIAGNOSTIC, W/WO COLLECTION SPECIMEN BY BRUSH OR WASH;  Surgeon: Trula Slade, MD;  Location: [facility];  Service: Gastroenterology   ??? PR COLONOSCOPY FLX DX W/COLLJ SPEC WHEN PFRMD N/A 10/24/2014    Procedure: COLONOSCOPY, FLEXIBLE, PROXIMAL TO SPLENIC FLEXURE; DIAGNOSTIC, W/WO COLLECTION SPECIMEN BY BRUSH OR WASH;  Surgeon: Trula Slade, MD;  Location: [facility];  Service: Gastroenterology   ??? PR COLONOSCOPY FLX DX W/COLLJ SPEC WHEN PFRMD N/A 04/08/2016    Procedure: COLONOSCOPY, FLEXIBLE, PROXIMAL TO SPLENIC FLEXURE; DIAGNOSTIC, W/WO COLLECTION SPECIMEN BY BRUSH OR WASH;  Surgeon: Modena Nunnery, MD;  Location: [facility];  Service: Gastroenterology   ??? PR COLONOSCOPY FLX DX W/COLLJ SPEC WHEN PFRMD N/A 12/27/2017    Procedure: COLONOSCOPY, FLEXIBLE, PROXIMAL TO SPLENIC FLEXURE; DIAGNOSTIC, W/WO COLLECTION SPECIMEN BY BRUSH OR WASH;  Surgeon: Rona Ravens, MD;  Location: [facility];  Service: Gastroenterology   ??? PR COLONOSCOPY FLX DX W/COLLJ SPEC WHEN PFRMD N/A 05/25/2018    Procedure: COLONOSCOPY, FLEXIBLE, PROXIMAL TO SPLENIC FLEXURE; DIAGNOSTIC, W/WO COLLECTION SPECIMEN BY BRUSH OR WASH;  Surgeon: Modena Nunnery, MD;  Location: [facility];  Service: Gastroenterology   ??? PR COLONOSCOPY FLX DX W/COLLJ SPEC WHEN PFRMD N/A 10/09/2018    Procedure: COLONOSCOPY, FLEXIBLE, PROXIMAL TO SPLENIC FLEXURE; DIAGNOSTIC, W/WO COLLECTION SPECIMEN BY BRUSH OR WASH;  Surgeon: Rona Ravens, MD;  Location: [facility];  Service: Gastroenterology   ??? PR COLONOSCOPY W/BIOPSY SINGLE/MULTIPLE N/A 01/26/2018    Procedure: COLONOSCOPY, FLEXIBLE, PROXIMAL TO SPLENIC FLEXURE; WITH BIOPSY, SINGLE OR MULTIPLE;  Surgeon: Modena Nunnery, MD;  Location: [facility];  Service: Gastroenterology         CURRENT MEDICATIONS:      Current Outpatient Medications:   ???  calcium-vitamin D (CALCIUM-VITAMIN D) 500 mg(1,250mg ) -200 unit per tablet, Take 1 tablet by mouth daily. , Disp: , Rfl:   ???  cyanocobalamin 2000 MCG tablet, Take 2,000 mcg by mouth daily., Disp: , Rfl:   ???  diphenoxylate-atropine (LOMOTIL) 2.5-0.025 mg per tablet, Take 1 tablet by  mouth 4 (four) times a day as needed for diarrhea for up to 30 doses., Disp: 30 tablet, Rfl: 0  ???  HYDROcodone-acetaminophen (NORCO) 5-325 mg per tablet, Take 1 tablet by mouth every eight (8) hours as needed., Disp: , Rfl:   ???  levothyroxine (SYNTHROID) 25 MCG tablet, Take 25 mcg by mouth daily at 0600. , Disp: , Rfl:   ???  metoprolol succinate (TOPROL-XL) 50 MG 24 hr tablet, Take 50 mg by mouth daily., Disp: , Rfl:   ???  multivitamin capsule, Take 1 capsule by mouth daily., Disp: , Rfl:   ???  ustekinumab (STELARA) 45 mg/0.5 mL Soln, 45 mg by abdominal subcutaneous route continuous., Disp: , Rfl:   ??? colestipoL (COLESTID) 1 gram tablet, Take 1-2 tablets approx. 10 minutes  before large meals. Take other medications 2-3 hours apart., Disp: 60 tablet, Rfl: 11  ???  predniSONE (DELTASONE) 10 MG tablet, Prednisone 40 mg daily for 2 weeks, then  30 mg, 20 mg, 15 mg, 10 mg, 5 mg daily each for 1 week, Disp: 120 tablet, Rfl: 0       VITAL SIGNS:  BP Readings from Last 4 Encounters:   05/16/19 139/64   11/27/18 160/67   10/09/18 109/59   10/01/18 115/64      Wt Readings from Last 4 Encounters:   05/16/19 49.4 kg (109 lb)   11/27/18 51.1 kg (112 lb 9.6 oz)   10/09/18 50.8 kg (112 lb)   10/01/18 51 kg (112 lb 8 oz)        BMI: Estimated body mass index is 18.14 kg/m?? as calculated from the following:    Height as of 10/09/18: 165.1 cm (5' 5).    Weight as of this encounter: 49.4 kg (109 lb).    BSA: Estimated body surface area is 1.51 meters squared as calculated from the following:    Height as of 10/09/18: 165.1 cm (5' 5).    Weight as of this encounter: 49.4 kg (109 lb).      PHYSICAL EXAM:    Constitutional:   Alert, oriented x 3, no acute distress, well nourished, and well hydrated.   Mental Status:   Thought organized, appropriate affect, pleasantly interactive, not anxious appearing.   HEENT:   PEERL, conjunctiva clear, anicteric, oropharynx clear, neck supple, no LAD.   Respiratory: Clear to auscultation, unlabored breathing.     Cardiac: Euvolemic, regular rate and rhythm, normal S1 and S2, no murmur.     Abdomen: Soft, normal bowel sounds, non-distended, non-tender, no organomegaly or masses.     Perianal/Rectal Exam Not performed.     Extremities:   No edema, well perfused.   Musculoskeletal: No joint swelling or tenderness noted, no deformities.     Skin: No rashes, jaundice or skin lesions noted.     Neuro: No focal deficits.          ASSESSMENT:    1. Crohns disease with Hx of small bowel resection due to stricturing and fistulizing luminal disease in 2008 and recurrence at anastomosis in 02/2010. Start of Humira in 04/2010       Course of Disease:  First diagnosis of Crohn's disease in approximately the late 1980s. At that time, she had cramping abdominal pain and diarrhea. She was started on a steroid taper and maintained on sulfasalazine. Remission until approximately 2004. In 2004 start of abdominal bloating, epigastric pain, and intermittent loose stools. 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 Start of ciprofloxacin therapy approximately in 2005 as well as Entocort and of Pentasa 4 g daily. Approximately in the beginning of 2007 decrease of entocort to 6 mg per day, however, still abdominal pain, bloating. 05/08/06 Ileocecal resection with ileostomy and repair of ileal fistula as well as takedown and repair of ileal sigmoid fistula with sigmoid colectomy (About 18 inches of small bowel and 4 inches of cecum/colon were resected). 06/22/06,ileostomy takedown with ileal colonic anastomosis. 11/2006 remission. Intolerance of 6-MP therapy (joint pain, hair loss, nausea).12/08Start of sulfasalazine due to joint pain. 5/09-10/2009 remission. 04/2010, start of Humira. 09/2010 -02/2012 clinical remission. 03/2014 mild symptoms in the settings of missing a dose of Humira 07/2014 remission but another interruption of Humira therapy for 4 weeks due to foot surgery 01/2015 increase to weekly Humira therapy in the setting of I3 recurrence 01/2017 continues weight loss on weekly Humira therapy 02/2017 CT showing several inflammatory small bowel segments 03/2017 start of prednisone taper , improvement 09/2017 after end of steroid taper recurrence of diarrhea despite weekly Humira, weight loss and mild abdominal pain.  10/2017- 09/2018 participating in etrolizumab trial without significant success 10/2018 start ustekinumab 05/2019 mild-moderate disease activity.      Montreal Classification: [id number]     Crohn's disease: She might have a mild increase in disease activity but it is difficult to distinguish between Crohn's disease in her grief over the death of her husband.  We will recheck labs today.  Additionally I proposed to start a steroid taper but she is in between the first and second Covid vaccination and thus we will not start the steroid taper until 3 days after the second Covid vaccination.  However she might have a component of bile acid diarrhea and we will try Colestid.  Also Lomotil 1 tablet daily is helping.  She is not tolerating Imodium since she is too constipated after 1 tablet of Imodium.    Follow-up in 2 weeks, if significant improvement with Colestid, no steroid taper    Anda Kraft Index for Crohn's Disease  General Well Being: 2 = Poor  Abdominal Pain:  0 = none  Number of Liquid Stools Per day: 4  Abdominal Mass:  0 = None  Number of Extraintestinal Manifestations:  1   1 point each for: 1. Arthritis/arthralgias, 2. Iritis/uveitis, 3. Active perianal dz, 4. Other active fistula, 5. Erythema nodosum or pyoderma, 6. Other    Total HBI Score (0-25):  7     Score:  Remission < 5;  Mild dz 5-7;  Moderate dz 8-16;  Severe > 16      Labs: CRP normalized, indicating some efficacy of Stelara.    B12 deficiency: Patient is on B12 sublingual substitution    Bone health: Vitamin D pending    Health maintenance: Prevnar 2015 and 2016. Pneumovax 2016.  Shingrix completed.  Currently between first and second Covid vaccine    Portions of this record have been created using Scientist, clinical (histocompatibility and immunogenetics). Dictation errors have been sought, but may not have been identified and corrected.    PLAN:  Patient Instructions   Try Colestid 2 tablets first thing in morning before eating and drinking. Wait 2 hours before you take the other medications.    Start prednisone taper after 2nd Covid shot (can start 3 days later)    Take lomotil 1 tablet as needed    Continue Stelara in the moment q 8 weeks    Labs today  DIAGNOSTIC STUDIES:  I have reviewed all pertinent diagnostic studies, including:    Colonoscopy 10/2018  Patent end-to-side ileo-colonic anastomosis,                        characterized by healthy appearing mucosa.                       - Multiple ulcers in the terminal ileum.                       - Simple Endoscopic Score for Crohn's Disease: 8,                        mucosal inflammatory changes secondary to Crohn's                        disease with ileitis.                       - The rectum, splenic flexure and transverse colon are                        normal.                       - Diverticulosis in the sigmoid colon and in the                        descending colon.        Laboratory results    All Labs  Office Visit on 05/16/2019   Component Date Value Ref Range Status   ??? CRP 05/16/2019 <5.0  <10.0 mg/L Final   ??? Iron 05/16/2019 109  35 - 165 ug/dL Final   ??? TIBC 16/12/9602 394.0  252.0 - 479.0 mg/dL Final   ??? Transferrin 05/16/2019 312.7  200.0 - 380.0 mg/dL Final   ??? Iron Saturation (%) 05/16/2019 28  15 - 50 % Final   ??? Sodium 05/16/2019 145  135 - 145 mmol/L Final   ??? Potassium 05/16/2019 3.6  3.5 - 5.0 mmol/L Final   ??? Chloride 05/16/2019 109* 98 - 107 mmol/L Final   ??? Anion Gap 05/16/2019 9  7 - 15 mmol/L Final   ??? CO2 05/16/2019 27.0  22.0 - 30.0 mmol/L Final   ??? BUN 05/16/2019 17  7 - 21 mg/dL Final   ??? Creatinine 05/16/2019 0.58* 0.60 - 1.00 mg/dL Final   ??? BUN/Creatinine Ratio 05/16/2019 29   Final   ??? EGFR CKD-EPI Non-African American,* 05/16/2019 >90  >=60 mL/min/1.47m2 Final   ??? EGFR CKD-EPI African American, Fem* 05/16/2019 >90  >=60 mL/min/1.15m2 Final   ??? Glucose 05/16/2019 108  70 - 179 mg/dL Final   ??? Calcium 54/11/8117 10.2  8.5 - 10.2 mg/dL Final   ??? Albumin 14/78/2956 4.2  3.5 - 5.0 g/dL Final   ??? Total Protein 05/16/2019 7.2  6.5 - 8.3 g/dL Final   ??? Total Bilirubin 05/16/2019 0.4  0.0 - 1.2 mg/dL Final   ??? AST 21/30/8657 24  14 - 38 U/L Final   ??? ALT 05/16/2019 17  <35 U/L Final   ??? Alkaline Phosphatase 05/16/2019 87  38 - 126 U/L Final   ??? WBC 05/16/2019 11.9* 4.5 - 11.0 10*9/L Final   ??? RBC 05/16/2019 3.98* 4.00 - 5.20 10*12/L Final   ???  HGB 05/16/2019 12.3  12.0 - 16.0 g/dL Final   ??? HCT 16/12/9602 37.2  36.0 - 46.0 % Final   ??? MCV 05/16/2019 93.5  80.0 - 100.0 fL Final   ??? MCH 05/16/2019 30.9  26.0 - 34.0 pg Final   ??? MCHC 05/16/2019 33.1  31.0 - 37.0 g/dL Final   ??? RDW 54/11/8117 14.3  12.0 - 15.0 % Final   ??? MPV 05/16/2019 8.1  7.0 - 10.0 fL Final   ??? Platelet 05/16/2019 263  150 - 440 10*9/L Final   ??? Neutrophils % 05/16/2019 87.0  % Final   ??? Lymphocytes % 05/16/2019 8.8  % Final   ??? Monocytes % 05/16/2019 3.5  % Final   ??? Eosinophils % 05/16/2019 0.0  % Final   ??? Basophils % 05/16/2019 0.1  % Final   ??? Absolute Neutrophils 05/16/2019 10.4* 2.0 - 7.5 10*9/L Final   ??? Absolute Lymphocytes 05/16/2019 1.0* 1.5 - 5.0 10*9/L Final   ??? Absolute Monocytes 05/16/2019 0.4  0.2 - 0.8 10*9/L Final   ??? Absolute Eosinophils 05/16/2019 0.0  0.0 - 0.4 10*9/L Final   ??? Absolute Basophils 05/16/2019 0.0  0.0 - 0.1 10*9/L Final   ??? Large Unstained Cells 05/16/2019 1  0 - 4 % Final

## 2019-05-17 LAB — VITAMIN D, TOTAL (25OH): Lab: 54.1

## 2019-05-28 ENCOUNTER — Encounter: Admit: 2019-05-28 | Discharge: 2019-05-29 | Payer: MEDICARE | Attending: Gastroenterology | Primary: Gastroenterology

## 2019-05-28 NOTE — Unmapped (Addendum)
Start steroid taper now    Keep me updated about bowel frequency    We will try Colestid at a later time point once the bowel frequency goes up again and you are off the pain medication.    Please contact endocrinologist , he can see your values in EPIC

## 2019-05-28 NOTE — Unmapped (Signed)
Gastroenterology Return Visit Note      HISTORY OF PRESENT ILLNESS: This is a 69 y.o. year old female with a history of Crohn's disease as outlined below.  Since her husband died in 04-19-2022 she is not doing well.  She lost additional 3 pounds of weight and still has about 4 bowel movements daily.  Due to increased back pain and hip pain she got recently steroid injections.  However her back pain persisted.  We planned a steroid taper after her second Covid vaccine starting 3 days later.  However she misunderstood and thought it would be 2 weeks later.  She got her second Covid vaccine 1 week ago.  Currently she has about 2 bowel movements daily but she is also on hydrocodone.  She did not need the Colestid yet which we planned last time due to the low bowel frequency.      PAST MEDICAL HISTORY:    Past Medical History:   Diagnosis Date   ??? Crohn's disease (CMS-HCC)    ??? Disease of thyroid gland    ??? Hypertension        PAST SURGICAL HISTORY:    Past Surgical History:   Procedure Laterality Date   ??? ABDOMINAL SURGERY      colon resection   ??? BUNIONECTOMY Left     with pins   ??? COLON SURGERY      resection   ??? FOOT SURGERY Left     bunionectomy; pins placed   ??? left foot surgery     ??? PR COLONOSCOPY FLX DX W/COLLJ SPEC WHEN PFRMD N/A 08/03/2012    Procedure: COLONOSCOPY, FLEXIBLE, PROXIMAL TO SPLENIC FLEXURE; DIAGNOSTIC, W/WO COLLECTION SPECIMEN BY BRUSH OR WASH;  Surgeon: Trula Slade, MD;  Location: [facility];  Service: Gastroenterology   ??? PR COLONOSCOPY FLX DX W/COLLJ SPEC WHEN PFRMD N/A 10/24/2014    Procedure: COLONOSCOPY, FLEXIBLE, PROXIMAL TO SPLENIC FLEXURE; DIAGNOSTIC, W/WO COLLECTION SPECIMEN BY BRUSH OR WASH;  Surgeon: Trula Slade, MD;  Location: [facility];  Service: Gastroenterology   ??? PR COLONOSCOPY FLX DX W/COLLJ SPEC WHEN PFRMD N/A 04/08/2016    Procedure: COLONOSCOPY, FLEXIBLE, PROXIMAL TO SPLENIC FLEXURE; DIAGNOSTIC, W/WO COLLECTION SPECIMEN BY BRUSH OR WASH;  Surgeon: Modena Nunnery, MD;  Location: [facility];  Service: Gastroenterology   ??? PR COLONOSCOPY FLX DX W/COLLJ SPEC WHEN PFRMD N/A 12/27/2017    Procedure: COLONOSCOPY, FLEXIBLE, PROXIMAL TO SPLENIC FLEXURE; DIAGNOSTIC, W/WO COLLECTION SPECIMEN BY BRUSH OR WASH;  Surgeon: Rona Ravens, MD;  Location: [facility];  Service: Gastroenterology   ??? PR COLONOSCOPY FLX DX W/COLLJ SPEC WHEN PFRMD N/A 05/25/2018    Procedure: COLONOSCOPY, FLEXIBLE, PROXIMAL TO SPLENIC FLEXURE; DIAGNOSTIC, W/WO COLLECTION SPECIMEN BY BRUSH OR WASH;  Surgeon: Modena Nunnery, MD;  Location: [facility];  Service: Gastroenterology   ??? PR COLONOSCOPY FLX DX W/COLLJ SPEC WHEN PFRMD N/A 10/09/2018    Procedure: COLONOSCOPY, FLEXIBLE, PROXIMAL TO SPLENIC FLEXURE; DIAGNOSTIC, W/WO COLLECTION SPECIMEN BY BRUSH OR WASH;  Surgeon: Rona Ravens, MD;  Location: [facility];  Service: Gastroenterology   ??? PR COLONOSCOPY W/BIOPSY SINGLE/MULTIPLE N/A 01/26/2018    Procedure: COLONOSCOPY, FLEXIBLE, PROXIMAL TO SPLENIC FLEXURE; WITH BIOPSY, SINGLE OR MULTIPLE;  Surgeon: Modena Nunnery, MD;  Location: [facility];  Service: Gastroenterology         CURRENT MEDICATIONS:      Current Outpatient Medications:   ???  calcium-vitamin  D (CALCIUM-VITAMIN D) 500 mg(1,250mg ) -200 unit per tablet, Take 1 tablet by mouth daily. , Disp: , Rfl:   ???  colestipoL (COLESTID) 1 gram tablet, Take 1-2 tablets approx. 10 minutes  before large meals. Take other medications 2-3 hours apart., Disp: 60 tablet, Rfl: 11  ???  cyanocobalamin 2000 MCG tablet, Take 2,000 mcg by mouth daily., Disp: , Rfl:   ???  diphenoxylate-atropine (LOMOTIL) 2.5-0.025 mg per tablet, Take 1 tablet by mouth 4 (four) times a day as needed for diarrhea for up to 30 doses., Disp: 30 tablet, Rfl: 0  ???  HYDROcodone-acetaminophen (NORCO) 5-325 mg per tablet, Take 1 tablet by mouth every eight (8) hours as needed., Disp: , Rfl:   ???  levothyroxine (SYNTHROID) 25 MCG tablet, Take 25 mcg by mouth daily at 0600. , Disp: , Rfl:   ???  metoprolol succinate (TOPROL-XL) 50 MG 24 hr tablet, Take 50 mg by mouth daily., Disp: , Rfl:   ???  multivitamin capsule, Take 1 capsule by mouth daily., Disp: , Rfl:   ???  predniSONE (DELTASONE) 10 MG tablet, Prednisone 40 mg daily for 2 weeks, then  30 mg, 20 mg, 15 mg, 10 mg, 5 mg daily each for 1 week, Disp: 120 tablet, Rfl: 0  ???  ustekinumab (STELARA) 45 mg/0.5 mL Soln, 45 mg by abdominal subcutaneous route continuous., Disp: , Rfl:        VITAL SIGNS:  BP Readings from Last 4 Encounters:   05/16/19 139/64   11/27/18 160/67   10/09/18 109/59   10/01/18 115/64      Wt Readings from Last 4 Encounters:   05/16/19 49.4 kg (109 lb)   11/27/18 51.1 kg (112 lb 9.6 oz)   10/09/18 50.8 kg (112 lb)   10/01/18 51 kg (112 lb 8 oz)        BMI: Estimated body mass index is 18.14 kg/m?? as calculated from the following:    Height as of 10/09/18: 165.1 cm (5' 5).    Weight as of 05/16/19: 49.4 kg (109 lb).    BSA: Estimated body surface area is 1.51 meters squared as calculated from the following:    Height as of 10/09/18: 165.1 cm (5' 5).    Weight as of 05/16/19: 49.4 kg (109 lb).      PHYSICAL EXAM:    Phone visit    ASSESSMENT:    1. Crohns disease with Hx of small bowel resection due to stricturing and fistulizing luminal disease in 2008 and recurrence at anastomosis in 02/2010. Start of Humira in 04/2010       Course of Disease:  First diagnosis of Crohn's disease in approximately the late 1980s. At that time, she had cramping abdominal pain and diarrhea. She was started on a steroid taper and maintained on sulfasalazine. Remission until approximately 2004. In 2004 start of abdominal bloating, epigastric pain, and intermittent loose stools. 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 Start of ciprofloxacin therapy approximately in 2005 as well as Entocort and of Pentasa 4 g daily. Approximately in the beginning of 2007 decrease of entocort to 6 mg per day, however, still abdominal pain, bloating. 05/08/06 Ileocecal resection with ileostomy and repair of ileal fistula as well as takedown and repair of ileal sigmoid fistula with sigmoid colectomy (About 18 inches of small bowel and 4 inches of cecum/colon were resected). 06/22/06,ileostomy takedown with ileal colonic anastomosis. 11/2006 remission. Intolerance of 6-MP therapy (joint pain, hair loss,  nausea).12/08Start of sulfasalazine due to joint pain. 5/09-10/2009 remission. 04/2010, start of Humira. 09/2010 -02/2012 clinical remission. 03/2014 mild symptoms in the settings of missing a dose of Humira 07/2014 remission but another interruption of Humira therapy for 4 weeks due to foot surgery 01/2015 increase to weekly Humira therapy in the setting of I3 recurrence 01/2017 continues weight loss on weekly Humira therapy 02/2017 CT showing several inflammatory small bowel segments 03/2017 start of prednisone taper , improvement 09/2017 after end of steroid taper recurrence of diarrhea despite weekly Humira, weight loss and mild abdominal pain.  10/2017- 09/2018 participating in etrolizumab trial without significant success 10/2018 start ustekinumab 05/2019 mild-moderate disease activity.      Montreal Classification: [id number]     Crohn's disease: The patient has currently reduced bowel frequency due to 1 tablet Lomotil as well as pain medication for her back pain.  Thus she did not start the Colestid.  Due to misunderstanding she did not start the steroid taper yet.  Of note is that an MRI performed outside shows some mild disc bulging in the region of L4-L5 and S1.  This may explain part of her problems.  There were no inflammatory changes. Currently we continue ustekinumab every 8 weeks and will not shorten the interval to every 4 weeks.     I encouraged the patient now to start the steroid taper.  She will then stop the pain medication and we will see how the bowel frequency and the pain in her back evolves.      Anda Kraft Index for Crohn's Disease  General Well Being: 2 = Poor  Abdominal Pain:  0 = none  Number of Liquid Stools Per day: 0 (2 soft)  Abdominal Mass:  0 = None  Number of Extraintestinal Manifestations:  1   1 point each for: 1. Arthritis/arthralgias, 2. Iritis/uveitis, 3. Active perianal dz, 4. Other active fistula, 5. Erythema nodosum or pyoderma, 6. Other    Total HBI Score (0-25):  3     Score:  Remission < 5;  Mild dz 5-7;  Moderate dz 8-16;  Severe > 16      Labs: review of labs show CRP normalized, indicating some efficacy of Stelara.    B12 deficiency: Patient is on B12 sublingual substitution and B12 is now high    Thyroid supplementation: Based on labs in 05/2019  Patient may be over substituted and I recommended to contact her endocrinologist.     Bone health: Vitamin D normal (54; 05/2019)    Health maintenance: Prevnar 2015 and 2016. Pneumovax 2016.  Shingrix completed.  Currently between first and second Covid vaccine    Portions of this record have been created using Scientist, clinical (histocompatibility and immunogenetics). Dictation errors have been sought, but may not have been identified and corrected.    I spent18 minutes on the phone visit with the patient on the date of service. I spent an additional 15 minutes on pre- and post-visit activities on the date of service.     The patient was not located and I was located within 250 yards of a hospital based location during the phone visit. The patient was physically located in [place] or a state in which I am permitted to provide care. The patient and/or parent/guardian understood that s/he may incur co-pays and cost sharing, and agreed to the telemedicine visit. The visit was reasonable and appropriate under the circumstances given the patient's presentation at the time.    The patient and/or parent/guardian has been  advised of the potential risks and limitations of this mode of treatment (including, but not limited to, the absence of in-person examination) and has agreed to be treated using telemedicine. The patient's/patient's family's questions regarding telemedicine have been answered.    If the visit was completed in an ambulatory setting, the patient and/or parent/guardian has also been advised to contact their provider???s office for worsening conditions, and seek emergency medical treatment and/or call 911 if the patient deems either necessary.          PLAN:  There are no Patient Instructions on file for this visit.      DIAGNOSTIC STUDIES:  I have reviewed all pertinent diagnostic studies, including:    Colonoscopy 10/2018  Patent end-to-side ileo-colonic anastomosis,                        characterized by healthy appearing mucosa.                       - Multiple ulcers in the terminal ileum.                       - Simple Endoscopic Score for Crohn's Disease: 8,                        mucosal inflammatory changes secondary to Crohn's                        disease with ileitis.                       - The rectum, splenic flexure and transverse colon are                        normal.                       - Diverticulosis in the sigmoid colon and in the                        descending colon.        Laboratory results    All Labs  No visits with results within 1 Week(s) from this visit.   Latest known visit with results is:   Office Visit on 05/16/2019   Component Date Value Ref Range Status   ??? CRP 05/16/2019 <5.0  <10.0 mg/L Final   ??? Ferritin 05/16/2019 44.2  3.0 - 151.0 ng/mL Final   ??? Vitamin D Total (25OH) 05/16/2019 54.1  20.0 - 80.0 ng/mL Final   ??? Vitamin B-12 05/16/2019 >1,000* 193 - 900 pg/ml Final   ??? TSH 05/16/2019 0.068* 0.600 - 3.300 uIU/mL Final   ??? Free T4 05/16/2019 1.35  0.71 - 1.40 ng/dL Final   ??? T3, Total 16/12/9602 1.0  1.0 - 1.7 ng/mL Final   ??? Iron 05/16/2019 109  35 - 165 ug/dL Final   ??? TIBC 54/11/8117 394.0  252.0 - 479.0 mg/dL Final   ??? Transferrin 05/16/2019 312.7  200.0 - 380.0 mg/dL Final   ??? Iron Saturation (%) 05/16/2019 28  15 - 50 % Final   ??? Sodium 05/16/2019 145  135 - 145 mmol/L Final   ??? Potassium 05/16/2019 3.6  3.5 - 5.0 mmol/L Final   ??? Chloride 05/16/2019 109* 98 - 107 mmol/L Final   ???  Anion Gap 05/16/2019 9  7 - 15 mmol/L Final   ??? CO2 05/16/2019 27.0  22.0 - 30.0 mmol/L Final   ??? BUN 05/16/2019 17  7 - 21 mg/dL Final   ??? Creatinine 05/16/2019 0.58* 0.60 - 1.00 mg/dL Final   ??? BUN/Creatinine Ratio 05/16/2019 29   Final   ??? EGFR CKD-EPI Non-African American,* 05/16/2019 >90  >=60 mL/min/1.68m2 Final   ??? EGFR CKD-EPI African American, Fem* 05/16/2019 >90  >=60 mL/min/1.83m2 Final   ??? Glucose 05/16/2019 108  70 - 179 mg/dL Final   ??? Calcium 86/57/8469 10.2  8.5 - 10.2 mg/dL Final   ??? Albumin 62/95/2841 4.2  3.5 - 5.0 g/dL Final   ??? Total Protein 05/16/2019 7.2  6.5 - 8.3 g/dL Final   ??? Total Bilirubin 05/16/2019 0.4  0.0 - 1.2 mg/dL Final   ??? AST 32/44/0102 24  14 - 38 U/L Final   ??? ALT 05/16/2019 17  <35 U/L Final   ??? Alkaline Phosphatase 05/16/2019 87  38 - 126 U/L Final   ??? WBC 05/16/2019 11.9* 4.5 - 11.0 10*9/L Final   ??? RBC 05/16/2019 3.98* 4.00 - 5.20 10*12/L Final   ??? HGB 05/16/2019 12.3  12.0 - 16.0 g/dL Final   ??? HCT 72/53/6644 37.2  36.0 - 46.0 % Final   ??? MCV 05/16/2019 93.5  80.0 - 100.0 fL Final   ??? MCH 05/16/2019 30.9  26.0 - 34.0 pg Final   ??? MCHC 05/16/2019 33.1  31.0 - 37.0 g/dL Final   ??? RDW 03/47/4259 14.3  12.0 - 15.0 % Final   ??? MPV 05/16/2019 8.1  7.0 - 10.0 fL Final   ??? Platelet 05/16/2019 263  150 - 440 10*9/L Final   ??? Neutrophils % 05/16/2019 87.0  % Final   ??? Lymphocytes % 05/16/2019 8.8  % Final   ??? Monocytes % 05/16/2019 3.5  % Final   ??? Eosinophils % 05/16/2019 0.0  % Final   ??? Basophils % 05/16/2019 0.1  % Final   ??? Absolute Neutrophils 05/16/2019 10.4* 2.0 - 7.5 10*9/L Final   ??? Absolute Lymphocytes 05/16/2019 1.0* 1.5 - 5.0 10*9/L Final   ??? Absolute Monocytes 05/16/2019 0.4  0.2 - 0.8 10*9/L Final   ??? Absolute Eosinophils 05/16/2019 0.0  0.0 - 0.4 10*9/L Final   ??? Absolute Basophils 05/16/2019 0.0  0.0 - 0.1 10*9/L Final   ??? Large Unstained Cells 05/16/2019 1  0 - 4 % Final

## 2019-06-30 NOTE — Unmapped (Signed)
Gastroenterology Return Visit Note      HISTORY OF PRESENT ILLNESS: This is a 69 y.o. year old female with a history of Crohn's disease as outlined below.  Since her husband died in 04/22/2022 she is not doing well.  She is still on hydrocodone for back pain and currently on 10 mg prednisone, and q 8 week Stelara. Bowel frequency around 6. She took Colestid for bloating as needed and thought the bloating got better. 1 lomotil as needed but due to increased bloating only possible every other day. She also received steroid injections for disk budlging and chronic back pain.    PAST MEDICAL HISTORY:    Past Medical History:   Diagnosis Date   ??? Crohn's disease (CMS-HCC)    ??? Disease of thyroid gland    ??? Hypertension        PAST SURGICAL HISTORY:    Past Surgical History:   Procedure Laterality Date   ??? ABDOMINAL SURGERY      colon resection   ??? BUNIONECTOMY Left     with pins   ??? COLON SURGERY      resection   ??? FOOT SURGERY Left     bunionectomy; pins placed   ??? left foot surgery     ??? PR COLONOSCOPY FLX DX W/COLLJ SPEC WHEN PFRMD N/A 08/03/2012    Procedure: COLONOSCOPY, FLEXIBLE, PROXIMAL TO SPLENIC FLEXURE; DIAGNOSTIC, W/WO COLLECTION SPECIMEN BY BRUSH OR WASH;  Surgeon: Trula Slade, MD;  Location: [facility];  Service: Gastroenterology   ??? PR COLONOSCOPY FLX DX W/COLLJ SPEC WHEN PFRMD N/A 10/24/2014    Procedure: COLONOSCOPY, FLEXIBLE, PROXIMAL TO SPLENIC FLEXURE; DIAGNOSTIC, W/WO COLLECTION SPECIMEN BY BRUSH OR WASH;  Surgeon: Trula Slade, MD;  Location: [facility];  Service: Gastroenterology   ??? PR COLONOSCOPY FLX DX W/COLLJ SPEC WHEN PFRMD N/A 04/08/2016    Procedure: COLONOSCOPY, FLEXIBLE, PROXIMAL TO SPLENIC FLEXURE; DIAGNOSTIC, W/WO COLLECTION SPECIMEN BY BRUSH OR WASH;  Surgeon: Modena Nunnery, MD;  Location: [facility];  Service: Gastroenterology   ??? PR COLONOSCOPY FLX DX W/COLLJ SPEC WHEN PFRMD N/A 12/27/2017    Procedure: COLONOSCOPY, FLEXIBLE, PROXIMAL TO SPLENIC FLEXURE; DIAGNOSTIC, W/WO COLLECTION SPECIMEN BY BRUSH OR WASH;  Surgeon: Rona Ravens, MD;  Location: [facility];  Service: Gastroenterology   ??? PR COLONOSCOPY FLX DX W/COLLJ SPEC WHEN PFRMD N/A 05/25/2018    Procedure: COLONOSCOPY, FLEXIBLE, PROXIMAL TO SPLENIC FLEXURE; DIAGNOSTIC, W/WO COLLECTION SPECIMEN BY BRUSH OR WASH;  Surgeon: Modena Nunnery, MD;  Location: [facility];  Service: Gastroenterology   ??? PR COLONOSCOPY FLX DX W/COLLJ SPEC WHEN PFRMD N/A 10/09/2018    Procedure: COLONOSCOPY, FLEXIBLE, PROXIMAL TO SPLENIC FLEXURE; DIAGNOSTIC, W/WO COLLECTION SPECIMEN BY BRUSH OR WASH;  Surgeon: Rona Ravens, MD;  Location: [facility];  Service: Gastroenterology   ??? PR COLONOSCOPY W/BIOPSY SINGLE/MULTIPLE N/A 01/26/2018    Procedure: COLONOSCOPY, FLEXIBLE, PROXIMAL TO SPLENIC FLEXURE; WITH BIOPSY, SINGLE OR MULTIPLE;  Surgeon: Modena Nunnery, MD;  Location: [facility];  Service: Gastroenterology         CURRENT MEDICATIONS:      Current Outpatient Medications:   ???  calcium-vitamin D (CALCIUM-VITAMIN D) 500 mg(1,250mg ) -200 unit per tablet, Take 1 tablet by mouth daily. , Disp: , Rfl:   ???  colestipoL (COLESTID) 1 gram tablet, Take 1-2 tablets approx. 10 minutes  before large meals. Take other medications 2-3 hours apart., Disp: 60  tablet, Rfl: 11  ???  cyanocobalamin 2000 MCG tablet, Take 2,000 mcg by mouth daily., Disp: , Rfl:   ???  diphenoxylate-atropine (LOMOTIL) 2.5-0.025 mg per tablet, Take 1 tablet by mouth 4 (four) times a day as needed for diarrhea for up to 30 doses., Disp: 30 tablet, Rfl: 0  ???  HYDROcodone-acetaminophen (NORCO) 5-325 mg per tablet, Take 1 tablet by mouth every eight (8) hours as needed., Disp: , Rfl:   ???  levothyroxine (SYNTHROID) 25 MCG tablet, Take 25 mcg by mouth daily at 0600. , Disp: , Rfl:   ???  metoprolol succinate (TOPROL-XL) 50 MG 24 hr tablet, Take 50 mg by mouth daily., Disp: , Rfl:   ???  multivitamin capsule, Take 1 capsule by mouth daily., Disp: , Rfl:   ???  predniSONE (DELTASONE) 10 MG tablet, Prednisone 40 mg daily for 2 weeks, then  30 mg, 20 mg, 15 mg, 10 mg, 5 mg daily each for 1 week (Patient taking differently: Prednisone 40 mg daily for 2 weeks, then  30 mg, 20 mg, 15 mg, 10 mg, 5 mg daily each for 1 week (07/02/2019-07/08/2019)), Disp: 120 tablet, Rfl: 0  ???  ustekinumab (STELARA) 45 mg/0.5 mL Soln, 45 mg by abdominal subcutaneous route continuous. Every 8 weeks, Disp: , Rfl:        VITAL SIGNS:  BP Readings from Last 4 Encounters:   05/16/19 139/64   11/27/18 160/67   10/09/18 109/59   10/01/18 115/64      Wt Readings from Last 4 Encounters:   05/16/19 49.4 kg (109 lb)   11/27/18 51.1 kg (112 lb 9.6 oz)   10/09/18 50.8 kg (112 lb)   10/01/18 51 kg (112 lb 8 oz)        BMI: Estimated body mass index is 18.14 kg/m?? as calculated from the following:    Height as of 10/09/18: 165.1 cm (5' 5).    Weight as of 05/16/19: 49.4 kg (109 lb).    BSA: Estimated body surface area is 1.51 meters squared as calculated from the following:    Height as of 10/09/18: 165.1 cm (5' 5).    Weight as of 05/16/19: 49.4 kg (109 lb).      PHYSICAL EXAM:    Phone visit    ASSESSMENT:    1. Crohns disease with Hx of small bowel resection due to stricturing and fistulizing luminal disease in 2008 and recurrence at anastomosis in 02/2010. Start of Humira in 04/2010       Course of Disease:  First diagnosis of Crohn's disease in approximately the late 1980s. At that time, she had cramping abdominal pain and diarrhea. She was started on a steroid taper and maintained on sulfasalazine. Remission until approximately 2004. In 2004 start of abdominal bloating, epigastric pain, and intermittent loose stools. 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 Start of ciprofloxacin therapy approximately in 2005 as well as Entocort and of Pentasa 4 g daily. Approximately in the beginning of 2007 decrease of entocort to 6 mg per day, however, still abdominal pain, bloating. 05/08/06 Ileocecal resection with ileostomy and repair of ileal fistula as well as takedown and repair of ileal sigmoid fistula with sigmoid colectomy (About 18 inches of small bowel and 4 inches of cecum/colon were resected). 06/22/06,ileostomy takedown with ileal colonic anastomosis. 11/2006 remission. Intolerance of 6-MP therapy (joint pain, hair loss, nausea).12/08Start of sulfasalazine due to joint pain. 5/09-10/2009 remission. 04/2010, start of Humira. 09/2010 -02/2012  clinical remission. 03/2014 mild symptoms in the settings of missing a dose of Humira 07/2014 remission but another interruption of Humira therapy for 4 weeks due to foot surgery 01/2015 increase to weekly Humira therapy in the setting of I3 recurrence 01/2017 continues weight loss on weekly Humira therapy 02/2017 CT showing several inflammatory small bowel segments 03/2017 start of prednisone taper , improvement 09/2017 after end of steroid taper recurrence of diarrhea despite weekly Humira, weight loss and mild abdominal pain.  10/2017- 09/2018 participating in etrolizumab trial without significant success 10/2018 start ustekinumab 05/2019 mild-moderate disease activity.      Montreal Classification: [id number]     Crohn's disease: The patient has currently around 4 bowel movements , which occur every time she eats. She takes  1 tablet Lomotil as well as pain medication for her back pain. She took Colestid as needed for bloating and could not remember that this was meant for bile acid malabsorption. I explained again the concept and we will now try 2 tablets before breakfast (she has still a gallbladder, and thus the before breakfast dose is optimal) . I advised her to take all other medications apart.     Back pain: MRI performed outside shows some mild disc bulging in the region of L4-L5 and S1.  T  There were no inflammatory changes. She received 2 steroid injections    I encouraged the patient now to start the steroid taper.  She will then stop the pain medication and we will see how the bowel frequency and the pain in her back evolves.      Anda Kraft Index for Crohn's Disease  General Well Being: 2 = Poor  Abdominal Pain:  0 = none  Number of Liquid Stools Per day: 4  Abdominal Mass:  0 = None  Number of Extraintestinal Manifestations:  1   1 point each for: 1. Arthritis/arthralgias, 2. Iritis/uveitis, 3. Active perianal dz, 4. Other active fistula, 5. Erythema nodosum or pyoderma, 6. Other    Total HBI Score (0-25):  7     Score:  Remission < 5;  Mild dz 5-7;  Moderate dz 8-16;  Severe > 16      Labs: review of labs show CRP normalized, indicating some efficacy of Stelara.    B12 deficiency: Patient is on B12 sublingual substitution and B12 is now high    Thyroid supplementation: Based on labs in 05/2019  Patient may be over substituted and I recommended to contact her endocrinologist.     Bone health: Vitamin D normal (54; 05/2019)    Health maintenance: Prevnar 2015 and 2016. Pneumovax 2016.  Shingrix completed.  Currently between first and second Covid vaccine    Portions of this record have been created using Scientist, clinical (histocompatibility and immunogenetics). Dictation errors have been sought, but may not have been identified and corrected.    I spent15 minutes on the phone visit with the patient on the date of service. I spent an additional 10 minutes on pre- and post-visit activities on the date of service.     The patient was not located and I was located within 250 yards of a hospital based location during the phone visit. The patient was physically located in [place] or a state in which I am permitted to provide care. The patient and/or parent/guardian understood that s/he may incur co-pays and cost sharing, and agreed to the telemedicine visit. The visit was reasonable and appropriate under the circumstances given the patient's presentation at the time.  The patient and/or parent/guardian has been advised of the potential risks and limitations of this mode of treatment (including, but not limited to, the absence of in-person examination) and has agreed to be treated using telemedicine. The patient's/patient's family's questions regarding telemedicine have been answered.    If the visit was completed in an ambulatory setting, the patient and/or parent/guardian has also been advised to contact their provider???s office for worsening conditions, and seek emergency medical treatment and/or call 911 if the patient deems either necessary.          PLAN:  Patient Instructions   Take Colestid 2 tablets first thing in the moment, take all other medications 2-3 hours later    Continue prednisone taper     Appointment in 4 weeks in person in early afternoon        DIAGNOSTIC STUDIES:  I have reviewed all pertinent diagnostic studies, including:    Colonoscopy 10/2018  Patent end-to-side ileo-colonic anastomosis,                        characterized by healthy appearing mucosa.                       - Multiple ulcers in the terminal ileum.                       - Simple Endoscopic Score for Crohn's Disease: 8,                        mucosal inflammatory changes secondary to Crohn's                        disease with ileitis.                       - The rectum, splenic flexure and transverse colon are                        normal.                       - Diverticulosis in the sigmoid colon and in the                        descending colon.        Laboratory results    All Labs  No visits with results within 1 Week(s) from this visit.   Latest known visit with results is:   Office Visit on 05/16/2019   Component Date Value Ref Range Status   ??? CRP 05/16/2019 <5.0  <10.0 mg/L Final   ??? Ferritin 05/16/2019 44.2  3.0 - 151.0 ng/mL Final   ??? Vitamin D Total (25OH) 05/16/2019 54.1  20.0 - 80.0 ng/mL Final   ??? Vitamin B-12 05/16/2019 >1,000* 193 - 900 pg/ml Final   ??? TSH 05/16/2019 0.068* 0.600 - 3.300 uIU/mL Final   ??? Free T4 05/16/2019 1.35  0.71 - 1.40 ng/dL Final   ??? T3, Total 16/12/9602 1.0  1.0 - 1.7 ng/mL Final   ??? Iron 05/16/2019 109  35 - 165 ug/dL Final   ??? TIBC 54/11/8117 394.0  252.0 - 479.0 mg/dL Final   ??? Transferrin 05/16/2019 312.7  200.0 - 380.0 mg/dL Final   ??? Iron Saturation (%) 05/16/2019 28  15 - 50 %  Final   ??? Sodium 05/16/2019 145  135 - 145 mmol/L Final   ??? Potassium 05/16/2019 3.6  3.5 - 5.0 mmol/L Final   ??? Chloride 05/16/2019 109* 98 - 107 mmol/L Final   ??? Anion Gap 05/16/2019 9  7 - 15 mmol/L Final   ??? CO2 05/16/2019 27.0  22.0 - 30.0 mmol/L Final   ??? BUN 05/16/2019 17  7 - 21 mg/dL Final   ??? Creatinine 05/16/2019 0.58* 0.60 - 1.00 mg/dL Final   ??? BUN/Creatinine Ratio 05/16/2019 29   Final   ??? EGFR CKD-EPI Non-African American,* 05/16/2019 >90  >=60 mL/min/1.44m2 Final   ??? EGFR CKD-EPI African American, Fem* 05/16/2019 >90  >=60 mL/min/1.58m2 Final   ??? Glucose 05/16/2019 108  70 - 179 mg/dL Final   ??? Calcium 16/12/9602 10.2  8.5 - 10.2 mg/dL Final   ??? Albumin 54/11/8117 4.2  3.5 - 5.0 g/dL Final   ??? Total Protein 05/16/2019 7.2  6.5 - 8.3 g/dL Final   ??? Total Bilirubin 05/16/2019 0.4  0.0 - 1.2 mg/dL Final   ??? AST 14/78/2956 24  14 - 38 U/L Final   ??? ALT 05/16/2019 17  <35 U/L Final   ??? Alkaline Phosphatase 05/16/2019 87  38 - 126 U/L Final   ??? WBC 05/16/2019 11.9* 4.5 - 11.0 10*9/L Final   ??? RBC 05/16/2019 3.98* 4.00 - 5.20 10*12/L Final   ??? HGB 05/16/2019 12.3  12.0 - 16.0 g/dL Final   ??? HCT 21/30/8657 37.2  36.0 - 46.0 % Final   ??? MCV 05/16/2019 93.5  80.0 - 100.0 fL Final   ??? MCH 05/16/2019 30.9  26.0 - 34.0 pg Final   ??? MCHC 05/16/2019 33.1  31.0 - 37.0 g/dL Final   ??? RDW 84/69/6295 14.3  12.0 - 15.0 % Final   ??? MPV 05/16/2019 8.1  7.0 - 10.0 fL Final   ??? Platelet 05/16/2019 263  150 - 440 10*9/L Final   ??? Neutrophils % 05/16/2019 87.0  % Final   ??? Lymphocytes % 05/16/2019 8.8  % Final   ??? Monocytes % 05/16/2019 3.5  % Final   ??? Eosinophils % 05/16/2019 0.0  % Final   ??? Basophils % 05/16/2019 0.1  % Final   ??? Absolute Neutrophils 05/16/2019 10.4* 2.0 - 7.5 10*9/L Final   ??? Absolute Lymphocytes 05/16/2019 1.0* 1.5 - 5.0 10*9/L Final   ??? Absolute Monocytes 05/16/2019 0.4  0.2 - 0.8 10*9/L Final   ??? Absolute Eosinophils 05/16/2019 0.0  0.0 - 0.4 10*9/L Final   ??? Absolute Basophils 05/16/2019 0.0  0.0 - 0.1 10*9/L Final   ??? Large Unstained Cells 05/16/2019 1  0 - 4 % Final

## 2019-07-02 ENCOUNTER — Encounter: Admit: 2019-07-02 | Discharge: 2019-07-03 | Payer: MEDICARE | Attending: Gastroenterology | Primary: Gastroenterology

## 2019-07-02 NOTE — Unmapped (Addendum)
Take Colestid 2 tablets first thing in the moment, take all other medications 2-3 hours later    Continue prednisone taper     Appointment in 4 weeks in person in early afternoon

## 2019-08-01 ENCOUNTER — Encounter: Admit: 2019-08-01 | Discharge: 2019-08-02 | Payer: MEDICARE | Attending: Gastroenterology | Primary: Gastroenterology

## 2019-08-01 DIAGNOSIS — K50019 Crohn's disease of small intestine with unspecified complications: Principal | ICD-10-CM

## 2019-08-01 DIAGNOSIS — Z79899 Other long term (current) drug therapy: Principal | ICD-10-CM

## 2019-08-01 DIAGNOSIS — D500 Iron deficiency anemia secondary to blood loss (chronic): Principal | ICD-10-CM

## 2019-08-01 DIAGNOSIS — R300 Dysuria: Principal | ICD-10-CM

## 2019-08-01 LAB — COMPREHENSIVE METABOLIC PANEL
ALBUMIN: 3.1 g/dL — ABNORMAL LOW (ref 3.4–5.0)
ALKALINE PHOSPHATASE: 109 U/L (ref 46–116)
ALT (SGPT): 14 U/L (ref 10–49)
ANION GAP: 7 mmol/L (ref 3–11)
AST (SGOT): 18 U/L (ref <34)
BLOOD UREA NITROGEN: 11 mg/dL (ref 9–23)
BUN / CREAT RATIO: 17
CALCIUM: 9.2 mg/dL (ref 8.7–10.4)
CHLORIDE: 110 mmol/L — ABNORMAL HIGH (ref 98–107)
CO2: 25.1 mmol/L (ref 20.0–31.0)
CREATININE: 0.66 mg/dL (ref 0.60–1.10)
EGFR CKD-EPI AA FEMALE: >90 mL/min/{1.73_m2}
EGFR CKD-EPI NON-AA FEMALE: 90 mL/min/{1.73_m2}
GLUCOSE RANDOM: 100 mg/dL (ref 70–179)
POTASSIUM: 3.4 mmol/L — ABNORMAL LOW (ref 3.5–5.1)
PROTEIN TOTAL: 6.2 g/dL (ref 5.7–8.2)
SODIUM: 142 mmol/L (ref 135–145)

## 2019-08-01 LAB — CBC W/ AUTO DIFF
BASOPHILS ABSOLUTE COUNT: 0.1 10*9/L (ref 0.0–0.1)
BASOPHILS RELATIVE PERCENT: 0.9 %
EOSINOPHILS ABSOLUTE COUNT: 0.2 10*9/L (ref 0.0–0.7)
EOSINOPHILS RELATIVE PERCENT: 2.4 %
HEMATOCRIT: 34.9 % — ABNORMAL LOW (ref 35.0–44.0)
HEMOGLOBIN: 11.6 g/dL — ABNORMAL LOW (ref 12.0–15.5)
LYMPHOCYTES ABSOLUTE COUNT: 1.5 10*9/L (ref 0.7–4.0)
LYMPHOCYTES RELATIVE PERCENT: 17.9 %
MEAN CORPUSCULAR HEMOGLOBIN CONC: 33.2 g/dL (ref 30.0–36.0)
MEAN CORPUSCULAR HEMOGLOBIN: 30.8 pg (ref 26.0–34.0)
MEAN PLATELET VOLUME: 6.6 fL — ABNORMAL LOW (ref 7.0–10.0)
MONOCYTES ABSOLUTE COUNT: 0.6 10*9/L (ref 0.1–1.0)
NEUTROPHILS ABSOLUTE COUNT: 6 10*9/L (ref 1.7–7.7)
NEUTROPHILS RELATIVE PERCENT: 71.4 %
NUCLEATED RED BLOOD CELLS: 0 /100{WBCs} (ref <=4)
PLATELET COUNT: 276 10*9/L (ref 150–450)
RED BLOOD CELL COUNT: 3.76 10*12/L — ABNORMAL LOW (ref 3.90–5.03)
RED CELL DISTRIBUTION WIDTH: 14.8 % (ref 12.0–15.0)
WBC ADJUSTED: 8.5 10*9/L (ref 3.5–10.5)

## 2019-08-01 LAB — URINALYSIS
BILIRUBIN UA: NEGATIVE
BLOOD UA: NEGATIVE
GLUCOSE UA: NEGATIVE
KETONES UA: NEGATIVE
NITRITE UA: NEGATIVE
PH UA: 5.5 (ref 5.0–9.0)
PROTEIN UA: NEGATIVE
RBC UA: <1 /HPF (ref <4)
SPECIFIC GRAVITY UA: >=1.03 (ref 1.005–1.030)
SQUAMOUS EPITHELIAL: 2 /HPF (ref 0–5)
UROBILINOGEN UA: 0.2
WBC UA: <1 /HPF (ref 0–5)

## 2019-08-01 LAB — PROTEIN UA: Protein:MCnc:Pt:Urine:Qn:Test strip: NEGATIVE

## 2019-08-01 LAB — C-REACTIVE PROTEIN: C reactive protein:MCnc:Pt:Ser/Plas:Qn:: 12 — ABNORMAL HIGH

## 2019-08-01 LAB — ALT (SGPT): Alanine aminotransferase:CCnc:Pt:Ser/Plas:Qn:: 14

## 2019-08-01 LAB — MEAN CORPUSCULAR HEMOGLOBIN: Erythrocyte mean corpuscular hemoglobin:EntMass:Pt:RBC:Qn:Automated count: 30.8

## 2019-08-01 NOTE — Unmapped (Addendum)
Labs today and urine analysis    Continue Stelara q 8 weeks    Keep me updated    For more information about inflammatory bowel diseases please also go to [employer]hn's [employer] ([email or web address]  Professor of Medicine  Department of Medicine  [facility]    Nurse contact:  Alesia Banda   Please use my chart for Email  Phone [phone]  Fax: [phone]    For clinic appointments, please call, [phone], option 1.  For questions regarding radiology appointments, or to schedule, [phone].  For questions regarding scheduling GI procedures (e.g,. Colonoscopy), please call, [phone], option 2.

## 2019-08-01 NOTE — Unmapped (Signed)
Gastroenterology Return Visit Note      HISTORY OF PRESENT ILLNESS: This is a 69 y.o. year old female with a history of Crohn's disease as outlined below.  She has mainly back problems now and sees a neurosurgeon next week. From CD aspect better, less blaoting , mild improvement of diarrhea since start of Colestid 1 tablet before each meal.     She reports some lower abdominal pain and thinks this may be a urinary tract infection.    PAST MEDICAL HISTORY:    Past Medical History:   Diagnosis Date   ??? Crohn's disease (CMS-HCC)    ??? Disease of thyroid gland    ??? Hypertension        PAST SURGICAL HISTORY:    Past Surgical History:   Procedure Laterality Date   ??? ABDOMINAL SURGERY      colon resection   ??? BUNIONECTOMY Left     with pins   ??? COLON SURGERY      resection   ??? FOOT SURGERY Left     bunionectomy; pins placed   ??? left foot surgery     ??? PR COLONOSCOPY FLX DX W/COLLJ SPEC WHEN PFRMD N/A 08/03/2012    Procedure: COLONOSCOPY, FLEXIBLE, PROXIMAL TO SPLENIC FLEXURE; DIAGNOSTIC, W/WO COLLECTION SPECIMEN BY BRUSH OR WASH;  Surgeon: Trula Slade, MD;  Location: [facility];  Service: Gastroenterology   ??? PR COLONOSCOPY FLX DX W/COLLJ SPEC WHEN PFRMD N/A 10/24/2014    Procedure: COLONOSCOPY, FLEXIBLE, PROXIMAL TO SPLENIC FLEXURE; DIAGNOSTIC, W/WO COLLECTION SPECIMEN BY BRUSH OR WASH;  Surgeon: Trula Slade, MD;  Location: [facility];  Service: Gastroenterology   ??? PR COLONOSCOPY FLX DX W/COLLJ SPEC WHEN PFRMD N/A 04/08/2016    Procedure: COLONOSCOPY, FLEXIBLE, PROXIMAL TO SPLENIC FLEXURE; DIAGNOSTIC, W/WO COLLECTION SPECIMEN BY BRUSH OR WASH;  Surgeon: Modena Nunnery, MD;  Location: [facility];  Service: Gastroenterology   ??? PR COLONOSCOPY FLX DX W/COLLJ SPEC WHEN PFRMD N/A 12/27/2017    Procedure: COLONOSCOPY, FLEXIBLE, PROXIMAL TO SPLENIC FLEXURE; DIAGNOSTIC, W/WO COLLECTION SPECIMEN BY BRUSH OR WASH;  Surgeon: Rona Ravens, MD;  Location: [facility];  Service: Gastroenterology   ??? PR COLONOSCOPY FLX DX W/COLLJ SPEC WHEN PFRMD N/A 05/25/2018    Procedure: COLONOSCOPY, FLEXIBLE, PROXIMAL TO SPLENIC FLEXURE; DIAGNOSTIC, W/WO COLLECTION SPECIMEN BY BRUSH OR WASH;  Surgeon: Modena Nunnery, MD;  Location: [facility];  Service: Gastroenterology   ??? PR COLONOSCOPY FLX DX W/COLLJ SPEC WHEN PFRMD N/A 10/09/2018    Procedure: COLONOSCOPY, FLEXIBLE, PROXIMAL TO SPLENIC FLEXURE; DIAGNOSTIC, W/WO COLLECTION SPECIMEN BY BRUSH OR WASH;  Surgeon: Rona Ravens, MD;  Location: [facility];  Service: Gastroenterology   ??? PR COLONOSCOPY W/BIOPSY SINGLE/MULTIPLE N/A 01/26/2018    Procedure: COLONOSCOPY, FLEXIBLE, PROXIMAL TO SPLENIC FLEXURE; WITH BIOPSY, SINGLE OR MULTIPLE;  Surgeon: Modena Nunnery, MD;  Location: [facility];  Service: Gastroenterology         CURRENT MEDICATIONS:      Current Outpatient Medications:   ???  calcium-vitamin D (CALCIUM-VITAMIN D) 500 mg(1,250mg ) -200 unit per tablet, Take 1 tablet by mouth daily. , Disp: , Rfl:   ???  colestipoL (COLESTID) 1 gram tablet, Take 1-2 tablets approx. 10 minutes  before large meals. Take other medications 2-3 hours apart., Disp: 60 tablet, Rfl: 11  ???  cyanocobalamin 2000 MCG tablet, Take 2,000 mcg by mouth daily., Disp: , Rfl:   ???  diphenoxylate-atropine (LOMOTIL) 2.5-0.025 mg per tablet, Take 1 tablet by mouth 4 (four) times a day as needed for diarrhea for up to 30 doses., Disp: 30 tablet, Rfl: 0  ???  levothyroxine (SYNTHROID) 25 MCG tablet, Take 25 mcg by mouth daily at 0600. , Disp: , Rfl:   ???  metoprolol succinate (TOPROL-XL) 50 MG 24 hr tablet, Take 50 mg by mouth daily., Disp: , Rfl:   ???  multivitamin capsule, Take 1 capsule by mouth daily., Disp: , Rfl:   ???  ustekinumab (STELARA) 45 mg/0.5 mL Soln, 45 mg by abdominal subcutaneous route continuous. Every 8 weeks, Disp: , Rfl:        VITAL SIGNS:  BP Readings from Last 4 Encounters:   08/01/19 139/69   05/16/19 139/64   11/27/18 160/67   10/09/18 109/59      Wt Readings from Last 4 Encounters:   08/01/19 50.3 kg (111 lb)   05/16/19 49.4 kg (109 lb)   11/27/18 51.1 kg (112 lb 9.6 oz)   10/09/18 50.8 kg (112 lb)        BMI: Estimated body mass index is 18.47 kg/m?? as calculated from the following:    Height as of 10/09/18: 165.1 cm (5' 5).    Weight as of this encounter: 50.3 kg (111 lb).    BSA: Estimated body surface area is 1.52 meters squared as calculated from the following:    Height as of 10/09/18: 165.1 cm (5' 5).    Weight as of this encounter: 50.3 kg (111 lb).      PHYSICAL EXAM:  Constitutional:   Alert, oriented x 3, no acute distress, well nourished, and well hydrated.   Mental Status:   Thought organized, appropriate affect, pleasantly interactive, not anxious appearing.   HEENT:   PEERL, conjunctiva clear, anicteric, oropharynx clear, neck supple, no LAD.   Respiratory: Clear to auscultation, unlabored breathing.     Cardiac: Euvolemic, regular rate and rhythm, normal S1 and S2, no murmur.     Abdomen: Soft, normal bowel sounds, non-distended, non-tender, no organomegaly or masses.     Perianal/Rectal Exam Not performed.     Extremities:   No edema, well perfused.   Musculoskeletal: No joint swelling or tenderness noted, no deformities.     Skin: No rashes, jaundice or skin lesions noted.     Neuro: No focal deficits.            ASSESSMENT:    1. Crohns disease with Hx of small bowel resection due to stricturing and fistulizing luminal disease in 2008 and recurrence at anastomosis in 02/2010. Start of Humira in 04/2010       Course of Disease:  First diagnosis of Crohn's disease in approximately the late 1980s. At that time, she had cramping abdominal pain and diarrhea. She was started on a steroid taper and maintained on sulfasalazine. Remission until approximately 2004. In 2004 start of abdominal bloating, epigastric pain, and intermittent loose stools. 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 Start of ciprofloxacin therapy approximately in 2005 as well as Entocort and of Pentasa 4 g daily. Approximately in the beginning of 2007 decrease of entocort to 6 mg per day, however, still abdominal pain, bloating. 05/08/06 Ileocecal resection with ileostomy and repair of ileal fistula as well as takedown and repair of ileal sigmoid fistula with sigmoid colectomy (About 18 inches of small bowel and 4 inches of cecum/colon were resected). 06/22/06,ileostomy takedown with ileal colonic  anastomosis. 11/2006 remission. Intolerance of 6-MP therapy (joint pain, hair loss, nausea).12/08Start of sulfasalazine due to joint pain. 5/09-10/2009 remission. 04/2010, start of Humira. 09/2010 -02/2012 clinical remission. 03/2014 mild symptoms in the settings of missing a dose of Humira 07/2014 remission but another interruption of Humira therapy for 4 weeks due to foot surgery 01/2015 increase to weekly Humira therapy in the setting of I3 recurrence 01/2017 continues weight loss on weekly Humira therapy 02/2017 CT showing several inflammatory small bowel segments 03/2017 start of prednisone taper , improvement 09/2017 after end of steroid taper recurrence of diarrhea despite weekly Humira, weight loss and mild abdominal pain.  10/2017- 09/2018 participating in etrolizumab trial without significant success 10/2018 start ustekinumab 05/2019 mild-moderate disease activity.      Montreal Classification: [id number]     Crohn's disease: The patient has currently around 3 bowel movements , overall improvbed since Colestid was started. The bloating is also improved. However in contrast her labs look worse with increase of CRP and decrease of albumin and new onset of mild anemia, which all may be due to the fact that prednisone was stopped. I will now increase the Stelara to 4 weekly dosing if approved and after visit with neurosurgeon.     Abdominal pain: No significant findings on abdominal exam. Urin test equivocal. I am wondering if she is starting to flare given the lab values.     Anda Kraft Index for Crohn's Disease  General Well Being: 1 = Slightly below par  Abdominal Pain:  1 = mild  Number of Liquid Stools Per day: 3  Abdominal Mass:  0 = None  Number of Extraintestinal Manifestations:  0   1 point each for: 1. Arthritis/arthralgias, 2. Iritis/uveitis, 3. Active perianal dz, 4. Other active fistula, 5. Erythema nodosum or pyoderma, 6. Other    Total HBI Score (0-25):  5     Score:  Remission < 5;  Mild dz 5-7;  Moderate dz 8-16;  Severe > 16      Back pain: MRI performed outside shows some mild disc bulging in the region of L4-L5 and S1.  T  There were no inflammatory changes. She received now 3 steroid injections and see a neurosurgeon next week    Labs: CRP mildly up, albumin decreased mild anemia. Urine analysis inidcative for UTI, will start 10 days Cipro which will also treat possible SIBO, which may cause the lower albumin.     B12 deficiency: Patient is on B12 sublingual substitution and B12 is now high (05/2019)    Thyroid supplementation: Based on labs in 05/2019  Patient may be over substituted and I recommended to contact her endocrinologist.     Bone health: Vitamin D normal (54; 05/2019)    Health maintenance: Prevnar 2015 and 2016. Pneumovax 2016.  Shingrix completed.  Currently between first and second Covid vaccine    Portions of this record have been created using Scientist, clinical (histocompatibility and immunogenetics). Dictation errors have been sought, but may not have been identified and corrected        PLAN:  Patient Instructions   Labs today and urine analysis    Continue Stelara q 8 weeks    Keep me updated    For more information about inflammatory bowel diseases please also go to Crohn's and Colitis Foundation ([email or web address]  Professor of Medicine  Department of Medicine  [facility]    Nurse contact: Alesia Banda   Please use my chart for Email  Phone [phone]  Fax: [phone]    For clinic appointments, please call, [phone], option 1.  For questions regarding radiology appointments, or to schedule, [phone].  For questions regarding scheduling GI procedures (e.g,. Colonoscopy), please call, [phone], option 2.          DIAGNOSTIC STUDIES:  I have reviewed all pertinent diagnostic studies, including:    Colonoscopy 10/2018  Patent end-to-side ileo-colonic anastomosis,                        characterized by healthy appearing mucosa.                       - Multiple ulcers in the terminal ileum.                       - Simple Endoscopic Score for Crohn's Disease: 8,                        mucosal inflammatory changes secondary to Crohn's                        disease with ileitis.                       - The rectum, splenic flexure and transverse colon are                        normal.                       - Diverticulosis in the sigmoid colon and in the                        descending colon.        Laboratory results    All Labs  Office Visit on 08/01/2019   Component Date Value Ref Range Status   ??? Sodium 08/01/2019 142  135 - 145 mmol/L Final   ??? Potassium 08/01/2019 3.4* 3.5 - 5.1 mmol/L Final   ??? Chloride 08/01/2019 110* 98 - 107 mmol/L Final   ??? Anion Gap 08/01/2019 7  3 - 11 mmol/L Final   ??? CO2 08/01/2019 25.1  20.0 - 31.0 mmol/L Final   ??? BUN 08/01/2019 11  9 - 23 mg/dL Final   ??? Creatinine 08/01/2019 0.66  0.60 - 1.10 mg/dL Final   ??? BUN/Creatinine Ratio 08/01/2019 17   Final   ??? EGFR CKD-EPI Non-African American,* 08/01/2019 90  mL/min/1.75m2 Final   ??? EGFR CKD-EPI African American, Fem* 08/01/2019 >90  mL/min/1.12m2 Final   ??? Glucose 08/01/2019 100  70 - 179 mg/dL Final   ??? Calcium 02/72/5366 9.2  8.7 - 10.4 mg/dL Final   ??? Albumin 44/05/4740 3.1* 3.4 - 5.0 g/dL Final   ??? Total Protein 08/01/2019 6.2  5.7 - 8.2 g/dL Final   ??? Total Bilirubin 08/01/2019 0.6  0.3 - 1.2 mg/dL Final ??? AST 59/56/3875 18  <34 U/L Final   ??? ALT 08/01/2019 14  10 - 49 U/L Final   ??? Alkaline Phosphatase 08/01/2019 109  46 - 116 U/L Final   ??? CRP 08/01/2019 12.0* <=10.0 mg/L Final   ??? WBC 08/01/2019 8.5  3.5 - 10.5 10*9/L Final   ??? RBC 08/01/2019 3.76* 3.90 - 5.03 10*12/L Final   ??? HGB 08/01/2019 11.6*  12.0 - 15.5 g/dL Final   ??? HCT 54/11/8117 34.9* 35.0 - 44.0 % Final   ??? MCV 08/01/2019 92.6  82.0 - 98.0 fL Final   ??? MCH 08/01/2019 30.8  26.0 - 34.0 pg Final   ??? MCHC 08/01/2019 33.2  30.0 - 36.0 g/dL Final   ??? RDW 14/78/2956 14.8  12.0 - 15.0 % Final   ??? MPV 08/01/2019 6.6* 7.0 - 10.0 fL Final   ??? Platelet 08/01/2019 276  150 - 450 10*9/L Final   ??? nRBC 08/01/2019 0  <=4 /100 WBCs Final   ??? Neutrophils % 08/01/2019 71.4  % Final   ??? Lymphocytes % 08/01/2019 17.9  % Final   ??? Monocytes % 08/01/2019 7.4  % Final   ??? Eosinophils % 08/01/2019 2.4  % Final   ??? Basophils % 08/01/2019 0.9  % Final   ??? Absolute Neutrophils 08/01/2019 6.0  1.7 - 7.7 10*9/L Final   ??? Absolute Lymphocytes 08/01/2019 1.5  0.7 - 4.0 10*9/L Final   ??? Absolute Monocytes 08/01/2019 0.6  0.1 - 1.0 10*9/L Final   ??? Absolute Eosinophils 08/01/2019 0.2  0.0 - 0.7 10*9/L Final   ??? Absolute Basophils 08/01/2019 0.1  0.0 - 0.1 10*9/L Final   ??? Color, UA 08/01/2019 Yellow   Final   ??? Clarity, UA 08/01/2019 Clear   Final   ??? Specific Gravity, UA 08/01/2019 >=1.030  1.005 - 1.030 Final   ??? pH, UA 08/01/2019 5.5  5.0 - 9.0 Final   ??? Leukocyte Esterase, UA 08/01/2019 Trace* Negative Final   ??? Nitrite, UA 08/01/2019 Negative  Negative Final   ??? Protein, UA 08/01/2019 Negative  Negative Final   ??? Glucose, UA 08/01/2019 Negative  Negative Final   ??? Ketones, UA 08/01/2019 Negative  Negative Final   ??? Urobilinogen, UA 08/01/2019 0.2 mg/dL  0.2 - 2.0 mg/dL Final   ??? Bilirubin, UA 08/01/2019 Negative  Negative Final   ??? Blood, UA 08/01/2019 Negative  Negative Final   ??? RBC, UA 08/01/2019 <1  <4 /HPF Final   ??? WBC, UA 08/01/2019 <1  0 - 5 /HPF Final   ??? Squam Epithel, UA 08/01/2019 2  0 - 5 /HPF Final   ??? Bacteria, UA 08/01/2019 Rare* None Seen /HPF Final   ??? Ca Oxalate Crystal, UA 08/01/2019 Many  /HPF Final

## 2019-08-02 DIAGNOSIS — N342 Other urethritis: Principal | ICD-10-CM

## 2019-08-02 LAB — FERRITIN: Chemistry studies:Cmplx:-:^Patient:Set:: 77.1

## 2019-08-02 MED ORDER — CIPROFLOXACIN 500 MG TABLET
ORAL_TABLET | Freq: Two times a day (BID) | ORAL | 0 refills | 10 days | Status: CP
Start: 2019-08-02 — End: 2019-09-01

## 2019-08-27 DIAGNOSIS — K50019 Crohn's disease of small intestine with unspecified complications: Principal | ICD-10-CM

## 2019-08-27 MED ORDER — STELARA 45 MG/0.5 ML SUBCUTANEOUS SOLUTION
5 refills | 0 days | Status: CP
Start: 2019-08-27 — End: ?

## 2019-08-27 NOTE — Unmapped (Signed)
Stelara refills authorized, [facility] to [facility].  [place] appt up to date.

## 2019-11-25 ENCOUNTER — Other Ambulatory Visit: Payer: Self-pay | Admitting: Family Medicine

## 2019-11-25 DIAGNOSIS — Z1231 Encounter for screening mammogram for malignant neoplasm of breast: Secondary | ICD-10-CM

## 2019-11-28 ENCOUNTER — Ambulatory Visit: Admit: 2019-11-28 | Payer: MEDICARE | Attending: Gastroenterology | Primary: Gastroenterology

## 2019-11-28 NOTE — Unmapped (Unsigned)
Gastroenterology Return Visit Note      HISTORY OF PRESENT ILLNESS: This is a 69 y.o. year old female with a history of Crohn's disease as outlined below.  She has mainly back problems now and sees a neurosurgeon next week. From CD aspect better, less blaoting , mild improvement of diarrhea since start of Colestid 1 tablet before each meal.     She reports some lower abdominal pain and thinks this may be a urinary tract infection.    PAST MEDICAL HISTORY:    Past Medical History:   Diagnosis Date   ??? Crohn's disease (CMS-HCC)    ??? Disease of thyroid gland    ??? Hypertension        PAST SURGICAL HISTORY:    Past Surgical History:   Procedure Laterality Date   ??? ABDOMINAL SURGERY      colon resection   ??? BUNIONECTOMY Left     with pins   ??? COLON SURGERY      resection   ??? FOOT SURGERY Left     bunionectomy; pins placed   ??? left foot surgery     ??? PR COLONOSCOPY FLX DX W/COLLJ SPEC WHEN PFRMD N/A 08/03/2012    Procedure: COLONOSCOPY, FLEXIBLE, PROXIMAL TO SPLENIC FLEXURE; DIAGNOSTIC, W/WO COLLECTION SPECIMEN BY BRUSH OR WASH;  Surgeon: Trula Slade, MD;  Location: [facility];  Service: Gastroenterology   ??? PR COLONOSCOPY FLX DX W/COLLJ SPEC WHEN PFRMD N/A 10/24/2014    Procedure: COLONOSCOPY, FLEXIBLE, PROXIMAL TO SPLENIC FLEXURE; DIAGNOSTIC, W/WO COLLECTION SPECIMEN BY BRUSH OR WASH;  Surgeon: Trula Slade, MD;  Location: [facility];  Service: Gastroenterology   ??? PR COLONOSCOPY FLX DX W/COLLJ SPEC WHEN PFRMD N/A 04/08/2016    Procedure: COLONOSCOPY, FLEXIBLE, PROXIMAL TO SPLENIC FLEXURE; DIAGNOSTIC, W/WO COLLECTION SPECIMEN BY BRUSH OR WASH;  Surgeon: Modena Nunnery, MD;  Location: [facility];  Service: Gastroenterology   ??? PR COLONOSCOPY FLX DX W/COLLJ SPEC WHEN PFRMD N/A 12/27/2017    Procedure: COLONOSCOPY, FLEXIBLE, PROXIMAL TO SPLENIC FLEXURE; DIAGNOSTIC, W/WO COLLECTION SPECIMEN BY BRUSH OR WASH;  Surgeon: Rona Ravens, MD;  Location: [facility];  Service: Gastroenterology   ??? PR COLONOSCOPY FLX DX W/COLLJ SPEC WHEN PFRMD N/A 05/25/2018    Procedure: COLONOSCOPY, FLEXIBLE, PROXIMAL TO SPLENIC FLEXURE; DIAGNOSTIC, W/WO COLLECTION SPECIMEN BY BRUSH OR WASH;  Surgeon: Modena Nunnery, MD;  Location: [facility];  Service: Gastroenterology   ??? PR COLONOSCOPY FLX DX W/COLLJ SPEC WHEN PFRMD N/A 10/09/2018    Procedure: COLONOSCOPY, FLEXIBLE, PROXIMAL TO SPLENIC FLEXURE; DIAGNOSTIC, W/WO COLLECTION SPECIMEN BY BRUSH OR WASH;  Surgeon: Rona Ravens, MD;  Location: [facility];  Service: Gastroenterology   ??? PR COLONOSCOPY W/BIOPSY SINGLE/MULTIPLE N/A 01/26/2018    Procedure: COLONOSCOPY, FLEXIBLE, PROXIMAL TO SPLENIC FLEXURE; WITH BIOPSY, SINGLE OR MULTIPLE;  Surgeon: Modena Nunnery, MD;  Location: [facility];  Service: Gastroenterology         CURRENT MEDICATIONS:      Current Outpatient Medications:   ???  calcium-vitamin D (CALCIUM-VITAMIN D) 500 mg(1,250mg ) -200 unit per tablet, Take 1 tablet by mouth daily. , Disp: , Rfl:   ???  colestipoL (COLESTID) 1 gram tablet, Take 1-2 tablets approx. 10 minutes  before large meals. Take other medications 2-3 hours apart., Disp: 60 tablet, Rfl: 11  ???  cyanocobalamin 2000 MCG tablet, Take 2,000 mcg by mouth daily., Disp: , Rfl:   ???  diphenoxylate-atropine (LOMOTIL) 2.5-0.025 mg per tablet, Take 1 tablet by mouth 4 (four) times a day as needed for diarrhea for up to 30 doses., Disp: 30 tablet, Rfl: 0  ???  levothyroxine (SYNTHROID) 25 MCG tablet, Take 25 mcg by mouth daily at 0600. , Disp: , Rfl:   ???  metoprolol succinate (TOPROL-XL) 50 MG 24 hr tablet, Take 50 mg by mouth daily., Disp: , Rfl:   ???  multivitamin capsule, Take 1 capsule by mouth daily., Disp: , Rfl:   ???  ustekinumab (STELARA) 45 mg/0.5 mL Soln, 90mg  subcutaneous Every 8 weeks, filled with [facility] [id number], Disp: 1 mL, Rfl: 5       VITAL SIGNS:  BP Readings from Last 4 Encounters:   08/01/19 139/69   05/16/19 139/64   11/27/18 160/67   10/09/18 109/59      Wt Readings from Last 4 Encounters:   08/01/19 50.3 kg (111 lb)   05/16/19 49.4 kg (109 lb)   11/27/18 51.1 kg (112 lb 9.6 oz)   10/09/18 50.8 kg (112 lb)        BMI: Estimated body mass index is 18.47 kg/m?? as calculated from the following:    Height as of 10/09/18: 165.1 cm (5' 5).    Weight as of 08/01/19: 50.3 kg (111 lb).    BSA: Estimated body surface area is 1.52 meters squared as calculated from the following:    Height as of 10/09/18: 165.1 cm (5' 5).    Weight as of 08/01/19: 50.3 kg (111 lb).      PHYSICAL EXAM:  Constitutional:   Alert, oriented x 3, no acute distress, well nourished, and well hydrated.   Mental Status:   Thought organized, appropriate affect, pleasantly interactive, not anxious appearing.   HEENT:   PEERL, conjunctiva clear, anicteric, oropharynx clear, neck supple, no LAD.   Respiratory: Clear to auscultation, unlabored breathing.     Cardiac: Euvolemic, regular rate and rhythm, normal S1 and S2, no murmur.     Abdomen: Soft, normal bowel sounds, non-distended, non-tender, no organomegaly or masses.     Perianal/Rectal Exam Not performed.     Extremities:   No edema, well perfused.   Musculoskeletal: No joint swelling or tenderness noted, no deformities.     Skin: No rashes, jaundice or skin lesions noted.     Neuro: No focal deficits.            ASSESSMENT:    1. Crohns disease with Hx of small bowel resection due to stricturing and fistulizing luminal disease in 2008 and recurrence at anastomosis in 02/2010. Start of Humira in 04/2010       Course of Disease:  First diagnosis of Crohn's disease in approximately the late 1980s. At that time, she had cramping abdominal pain and diarrhea. She was started on a steroid taper and maintained on sulfasalazine. Remission until approximately 2004. In 2004 start of abdominal bloating, epigastric pain, and intermittent loose stools. 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 Start of ciprofloxacin therapy approximately in 2005 as well as Entocort and of Pentasa 4 g daily. Approximately in the beginning of 2007 decrease of entocort to 6 mg per day, however, still abdominal pain, bloating. 05/08/06 Ileocecal resection with ileostomy and repair of ileal fistula as well as takedown and repair of ileal sigmoid fistula with sigmoid colectomy (About 18 inches of small bowel and 4 inches of cecum/colon were resected). 06/22/06,ileostomy takedown with ileal colonic anastomosis.  11/2006 remission. Intolerance of 6-MP therapy (joint pain, hair loss, nausea).12/08Start of sulfasalazine due to joint pain. 5/09-10/2009 remission. 04/2010, start of Humira. 09/2010 -02/2012 clinical remission. 03/2014 mild symptoms in the settings of missing a dose of Humira 07/2014 remission but another interruption of Humira therapy for 4 weeks due to foot surgery 01/2015 increase to weekly Humira therapy in the setting of I3 recurrence 01/2017 continues weight loss on weekly Humira therapy 02/2017 CT showing several inflammatory small bowel segments 03/2017 start of prednisone taper , improvement 09/2017 after end of steroid taper recurrence of diarrhea despite weekly Humira, weight loss and mild abdominal pain.  10/2017- 09/2018 participating in etrolizumab trial without significant success 10/2018 start ustekinumab 05/2019 mild-moderate disease activity.      Montreal Classification: [id number]     Crohn's disease: The patient has currently around 3 bowel movements , overall improvbed since Colestid was started. The bloating is also improved. However in contrast her labs look worse with increase of CRP and decrease of albumin and new onset of mild anemia, which all may be due to the fact that prednisone was stopped. I will now increase the Stelara to 4 weekly dosing if approved and after visit with neurosurgeon.     Abdominal pain: No significant findings on abdominal exam. Urin test equivocal. I am wondering if she is starting to flare given the lab values.     Anda Kraft Index for Crohn's Disease  General Well Being: 1 = Slightly below par  Abdominal Pain:  1 = mild  Number of Liquid Stools Per day: 3  Abdominal Mass:  0 = None  Number of Extraintestinal Manifestations:  0   1 point each for: 1. Arthritis/arthralgias, 2. Iritis/uveitis, 3. Active perianal dz, 4. Other active fistula, 5. Erythema nodosum or pyoderma, 6. Other    Total HBI Score (0-25):  5     Score:  Remission < 5;  Mild dz 5-7;  Moderate dz 8-16;  Severe > 16      Back pain: MRI performed outside shows some mild disc bulging in the region of L4-L5 and S1.  T  There were no inflammatory changes. She received now 3 steroid injections and see a neurosurgeon next week    Labs: CRP mildly up, albumin decreased mild anemia. Urine analysis inidcative for UTI, will start 10 days Cipro which will also treat possible SIBO, which may cause the lower albumin.     B12 deficiency: Patient is on B12 sublingual substitution and B12 is now high (05/2019)    Thyroid supplementation: Based on labs in 05/2019  Patient may be over substituted and I recommended to contact her endocrinologist.     Bone health: Vitamin D normal (54; 05/2019)    Health maintenance: Prevnar 2015 and 2016. Pneumovax 2016.  Shingrix completed.  Currently between first and second Covid vaccine    Portions of this record have been created using Scientist, clinical (histocompatibility and immunogenetics). Dictation errors have been sought, but may not have been identified and corrected        PLAN:  There are no Patient Instructions on file for this visit.      DIAGNOSTIC STUDIES:  I have reviewed all pertinent diagnostic studies, including:    Colonoscopy 10/2018  Patent end-to-side ileo-colonic anastomosis,                        characterized by healthy appearing mucosa.                       -  Multiple ulcers in the terminal ileum. - Simple Endoscopic Score for Crohn's Disease: 8,                        mucosal inflammatory changes secondary to Crohn's                        disease with ileitis.                       - The rectum, splenic flexure and transverse colon are                        normal.                       - Diverticulosis in the sigmoid colon and in the                        descending colon.        Laboratory results    All Labs  No visits with results within 1 Week(s) from this visit.   Latest known visit with results is:   Office Visit on 08/01/2019   Component Date Value Ref Range Status   ??? Sodium 08/01/2019 142  135 - 145 mmol/L Final   ??? Potassium 08/01/2019 3.4* 3.5 - 5.1 mmol/L Final   ??? Chloride 08/01/2019 110* 98 - 107 mmol/L Final   ??? Anion Gap 08/01/2019 7  3 - 11 mmol/L Final   ??? CO2 08/01/2019 25.1  20.0 - 31.0 mmol/L Final   ??? BUN 08/01/2019 11  9 - 23 mg/dL Final   ??? Creatinine 08/01/2019 0.66  0.60 - 1.10 mg/dL Final   ??? BUN/Creatinine Ratio 08/01/2019 17   Final   ??? EGFR CKD-EPI Non-African American,* 08/01/2019 90  mL/min/1.70m2 Final   ??? EGFR CKD-EPI African American, Fem* 08/01/2019 >90  mL/min/1.79m2 Final   ??? Glucose 08/01/2019 100  70 - 179 mg/dL Final   ??? Calcium 16/12/9602 9.2  8.7 - 10.4 mg/dL Final   ??? Albumin 54/11/8117 3.1* 3.4 - 5.0 g/dL Final   ??? Total Protein 08/01/2019 6.2  5.7 - 8.2 g/dL Final   ??? Total Bilirubin 08/01/2019 0.6  0.3 - 1.2 mg/dL Final   ??? AST 14/78/2956 18  <34 U/L Final   ??? ALT 08/01/2019 14  10 - 49 U/L Final   ??? Alkaline Phosphatase 08/01/2019 109  46 - 116 U/L Final   ??? CRP 08/01/2019 12.0* <=10.0 mg/L Final   ??? WBC 08/01/2019 8.5  3.5 - 10.5 10*9/L Final   ??? RBC 08/01/2019 3.76* 3.90 - 5.03 10*12/L Final   ??? HGB 08/01/2019 11.6* 12.0 - 15.5 g/dL Final   ??? HCT 21/30/8657 34.9* 35.0 - 44.0 % Final   ??? MCV 08/01/2019 92.6  82.0 - 98.0 fL Final   ??? MCH 08/01/2019 30.8  26.0 - 34.0 pg Final   ??? MCHC 08/01/2019 33.2  30.0 - 36.0 g/dL Final   ??? RDW 84/69/6295 14.8  12.0 - 15.0 % Final   ??? MPV 08/01/2019 6.6* 7.0 - 10.0 fL Final   ??? Platelet 08/01/2019 276  150 - 450 10*9/L Final   ??? nRBC 08/01/2019 0  <=4 /100 WBCs Final   ??? Neutrophils % 08/01/2019 71.4  % Final   ??? Lymphocytes % 08/01/2019 17.9  % Final   ???  Monocytes % 08/01/2019 7.4  % Final   ??? Eosinophils % 08/01/2019 2.4  % Final   ??? Basophils % 08/01/2019 0.9  % Final   ??? Absolute Neutrophils 08/01/2019 6.0  1.7 - 7.7 10*9/L Final   ??? Absolute Lymphocytes 08/01/2019 1.5  0.7 - 4.0 10*9/L Final   ??? Absolute Monocytes 08/01/2019 0.6  0.1 - 1.0 10*9/L Final   ??? Absolute Eosinophils 08/01/2019 0.2  0.0 - 0.7 10*9/L Final   ??? Absolute Basophils 08/01/2019 0.1  0.0 - 0.1 10*9/L Final   ??? Color, UA 08/01/2019 Yellow   Final   ??? Clarity, UA 08/01/2019 Clear   Final   ??? Specific Gravity, UA 08/01/2019 >=1.030  1.005 - 1.030 Final   ??? pH, UA 08/01/2019 5.5  5.0 - 9.0 Final   ??? Leukocyte Esterase, UA 08/01/2019 Trace* Negative Final   ??? Nitrite, UA 08/01/2019 Negative  Negative Final   ??? Protein, UA 08/01/2019 Negative  Negative Final   ??? Glucose, UA 08/01/2019 Negative  Negative Final   ??? Ketones, UA 08/01/2019 Negative  Negative Final   ??? Urobilinogen, UA 08/01/2019 0.2 mg/dL  0.2 - 2.0 mg/dL Final   ??? Bilirubin, UA 08/01/2019 Negative  Negative Final   ??? Blood, UA 08/01/2019 Negative  Negative Final   ??? RBC, UA 08/01/2019 <1  <4 /HPF Final   ??? WBC, UA 08/01/2019 <1  0 - 5 /HPF Final   ??? Squam Epithel, UA 08/01/2019 2  0 - 5 /HPF Final   ??? Bacteria, UA 08/01/2019 Rare* None Seen /HPF Final   ??? Ca Oxalate Crystal, UA 08/01/2019 Many  /HPF Final   ??? Ferritin 08/01/2019 77.1  7.3 - 270.7 ng/mL Final

## 2019-12-20 DIAGNOSIS — K50019 Crohn's disease of small intestine with unspecified complications: Principal | ICD-10-CM

## 2019-12-20 MED ORDER — STELARA 45 MG/0.5 ML SUBCUTANEOUS SOLUTION
SUBCUTANEOUS | 0 refills | 0.00000 days | Status: CP
Start: 2019-12-20 — End: ?

## 2019-12-20 NOTE — Unmapped (Signed)
Stelara refill authorized X1. Follow up appt needed with Dr. Gwenith Spitz, patient will be contacted by [facility] GI central scheduling to schedule

## 2019-12-30 NOTE — Unmapped (Signed)
Patient is doing well and answers No to all below questions.     1. Have you been  experiencing any  persistent difficulty with  your vision such as loss  of vision or double  vision? Have you been  having trouble with  reading?  ??  2. Have you been  experiencing any  persistent difficulty  speaking or having your  speech understood by  others?  ??  3. Have you been  experiencing any  persistent weakness in  an arm or a leg?  ??  4. Have you noticed  yourself regularly  bumping into things or  having difficulty writing?  ??  5. Have you regularly  been experiencing  difficulty understanding  others?  ??  6. Have you had  persistent problems with  your memory or thinking?  ??  7. Have you been  experiencing any  persistent numbness or  other loss of sensation?  ??  ??  Still some ongoing diarrhea with 1 tablet Colestid but improvement.

## 2020-01-14 ENCOUNTER — Ambulatory Visit
Admission: RE | Admit: 2020-01-14 | Discharge: 2020-01-14 | Disposition: A | Discharge status: Home / Self Care | Payer: Medicare Other | Source: Ambulatory Visit | Attending: Family Medicine | Admitting: Family Medicine

## 2020-01-14 ENCOUNTER — Other Ambulatory Visit: Payer: Self-pay

## 2020-01-14 DIAGNOSIS — Z1231 Encounter for screening mammogram for malignant neoplasm of breast: Secondary | ICD-10-CM | POA: Insufficient documentation

## 2020-02-10 DIAGNOSIS — K50019 Crohn's disease of small intestine with unspecified complications: Principal | ICD-10-CM

## 2020-02-10 MED ORDER — STELARA 45 MG/0.5 ML SUBCUTANEOUS SOLUTION
2 refills | 0 days | Status: CP
Start: 2020-02-10 — End: ?

## 2020-02-10 NOTE — Unmapped (Signed)
Stelara refill authorization sent to [facility] today by fax, [place] follow up planned for 02/27/2020.

## 2020-02-25 NOTE — Unmapped (Signed)
Gastroenterology Return Visit Note      HISTORY OF PRESENT ILLNESS: This is a 69 y.o. year old female with a history of Crohn's disease as outlined below.  She has mainly back problems now and sees a neurosurgeon next week. From CD aspect better, less blaoting , mild improvement of diarrhea since start of Colestid 1 tablet before each meal.     She reports some lower abdominal pain and thinks this may be a urinary tract infection.    PAST MEDICAL HISTORY:    Past Medical History:   Diagnosis Date   ??? Crohn's disease (CMS-HCC)    ??? Disease of thyroid gland    ??? Hypertension        PAST SURGICAL HISTORY:    Past Surgical History:   Procedure Laterality Date   ??? ABDOMINAL SURGERY      colon resection   ??? BUNIONECTOMY Left     with pins   ??? COLON SURGERY      resection   ??? FOOT SURGERY Left     bunionectomy; pins placed   ??? left foot surgery     ??? PR COLONOSCOPY FLX DX W/COLLJ SPEC WHEN PFRMD N/A 08/03/2012    Procedure: COLONOSCOPY, FLEXIBLE, PROXIMAL TO SPLENIC FLEXURE; DIAGNOSTIC, W/WO COLLECTION SPECIMEN BY BRUSH OR WASH;  Surgeon: Trula Slade, MD;  Location: [facility];  Service: Gastroenterology   ??? PR COLONOSCOPY FLX DX W/COLLJ SPEC WHEN PFRMD N/A 10/24/2014    Procedure: COLONOSCOPY, FLEXIBLE, PROXIMAL TO SPLENIC FLEXURE; DIAGNOSTIC, W/WO COLLECTION SPECIMEN BY BRUSH OR WASH;  Surgeon: Trula Slade, MD;  Location: [facility];  Service: Gastroenterology   ??? PR COLONOSCOPY FLX DX W/COLLJ SPEC WHEN PFRMD N/A 04/08/2016    Procedure: COLONOSCOPY, FLEXIBLE, PROXIMAL TO SPLENIC FLEXURE; DIAGNOSTIC, W/WO COLLECTION SPECIMEN BY BRUSH OR WASH;  Surgeon: Modena Nunnery, MD;  Location: [facility];  Service: Gastroenterology   ??? PR COLONOSCOPY FLX DX W/COLLJ SPEC WHEN PFRMD N/A 12/27/2017    Procedure: COLONOSCOPY, FLEXIBLE, PROXIMAL TO SPLENIC FLEXURE; DIAGNOSTIC, W/WO COLLECTION SPECIMEN BY BRUSH OR WASH;  Surgeon: Rona Ravens, MD;  Location: [facility];  Service: Gastroenterology   ??? PR COLONOSCOPY FLX DX W/COLLJ SPEC WHEN PFRMD N/A 05/25/2018    Procedure: COLONOSCOPY, FLEXIBLE, PROXIMAL TO SPLENIC FLEXURE; DIAGNOSTIC, W/WO COLLECTION SPECIMEN BY BRUSH OR WASH;  Surgeon: Modena Nunnery, MD;  Location: [facility]  Service: Gastroenterology   ??? PR COLONOSCOPY FLX DX W/COLLJ SPEC WHEN PFRMD N/A 10/09/2018    Procedure: COLONOSCOPY, FLEXIBLE, PROXIMAL TO SPLENIC FLEXURE; DIAGNOSTIC, W/WO COLLECTION SPECIMEN BY BRUSH OR WASH;  Surgeon: Rona Ravens, MD;  Location: [facility];  Service: Gastroenterology   ??? PR COLONOSCOPY W/BIOPSY SINGLE/MULTIPLE N/A 01/26/2018    Procedure: COLONOSCOPY, FLEXIBLE, PROXIMAL TO SPLENIC FLEXURE; WITH BIOPSY, SINGLE OR MULTIPLE;  Surgeon: Modena Nunnery, MD;  Location: [facility];  Service: Gastroenterology         CURRENT MEDICATIONS:      Current Outpatient Medications:   ???  calcium-vitamin D (CALCIUM-VITAMIN D) 500 mg(1,250mg ) -200 unit per tablet, Take 1 tablet by mouth daily. , Disp: , Rfl:   ???  colestipoL (COLESTID) 1 gram tablet, Take 1-2 tablets approx. 10 minutes  before large meals. Take other medications 2-3 hours apart., Disp: 60 tablet, Rfl: 11  ???  cyanocobalamin 2000 MCG tablet, Take 2,000 mcg by mouth daily., Disp: , Rfl:   ???  diphenoxylate-atropine (LOMOTIL) 2.5-0.025 mg per tablet, Take 1 tablet by mouth 4 (four) times a day as needed for diarrhea for up to 30 doses., Disp: 30 tablet, Rfl: 0  ???  levothyroxine (SYNTHROID) 25 MCG tablet, Take 25 mcg by mouth daily at 0600. , Disp: , Rfl:   ???  metoprolol succinate (TOPROL-XL) 50 MG 24 hr tablet, Take 50 mg by mouth daily., Disp: , Rfl:   ???  multivitamin capsule, Take 1 capsule by mouth daily., Disp: , Rfl:   ???  ustekinumab (STELARA) 45 mg/0.5 mL Soln, 90mg  subcutaneous Every 8 weeks, filled with [facility] [id number], Disp: 1 mL, Rfl: 2       VITAL SIGNS:  BP Readings from Last 4 Encounters:   08/01/19 139/69   05/16/19 139/64   11/27/18 160/67   10/09/18 109/59      Wt Readings from Last 4 Encounters:   08/01/19 50.3 kg (111 lb)   05/16/19 49.4 kg (109 lb)   11/27/18 51.1 kg (112 lb 9.6 oz)   10/09/18 50.8 kg (112 lb)        BMI: Estimated body mass index is 18.47 kg/m?? as calculated from the following:    Height as of 10/09/18: 165.1 cm (5' 5).    Weight as of 08/01/19: 50.3 kg (111 lb).    BSA: Estimated body surface area is 1.52 meters squared as calculated from the following:    Height as of 10/09/18: 165.1 cm (5' 5).    Weight as of 08/01/19: 50.3 kg (111 lb).      PHYSICAL EXAM:  Constitutional:   Alert, oriented x 3, no acute distress, well nourished, and well hydrated.   Mental Status:   Thought organized, appropriate affect, pleasantly interactive, not anxious appearing.   HEENT:   PEERL, conjunctiva clear, anicteric, oropharynx clear, neck supple, no LAD.   Respiratory: Clear to auscultation, unlabored breathing.     Cardiac: Euvolemic, regular rate and rhythm, normal S1 and S2, no murmur.     Abdomen: Soft, normal bowel sounds, non-distended, non-tender, no organomegaly or masses.     Perianal/Rectal Exam Not performed.     Extremities:   No edema, well perfused.   Musculoskeletal: No joint swelling or tenderness noted, no deformities.     Skin: No rashes, jaundice or skin lesions noted.     Neuro: No focal deficits.            ASSESSMENT:    1. Crohns disease with Hx of small bowel resection due to stricturing and fistulizing luminal disease in 2008 and recurrence at anastomosis in 02/2010. Start of Humira in 04/2010       Course of Disease:  First diagnosis of Crohn's disease in approximately the late 1980s. At that time, she had cramping abdominal pain and diarrhea. She was started on a steroid taper and maintained on sulfasalazine. Remission until approximately 2004. In 2004 start of abdominal bloating, epigastric pain, and intermittent loose stools. 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 Start of ciprofloxacin therapy approximately in 2005 as well as Entocort and of Pentasa 4 g daily. Approximately in the beginning of 2007 decrease of entocort to 6 mg per day, however, still abdominal pain, bloating. 05/08/06 Ileocecal resection with ileostomy and repair of ileal fistula as well as takedown and repair of ileal sigmoid fistula with sigmoid colectomy (About 18 inches of small bowel and 4 inches of cecum/colon were resected). 06/22/06,ileostomy takedown with ileal colonic anastomosis.  11/2006 remission. Intolerance of 6-MP therapy (joint pain, hair loss, nausea).12/08Start of sulfasalazine due to joint pain. 5/09-10/2009 remission. 04/2010, start of Humira. 09/2010 -02/2012 clinical remission. 03/2014 mild symptoms in the settings of missing a dose of Humira 07/2014 remission but another interruption of Humira therapy for 4 weeks due to foot surgery 01/2015 increase to weekly Humira therapy in the setting of I3 recurrence 01/2017 continues weight loss on weekly Humira therapy 02/2017 CT showing several inflammatory small bowel segments 03/2017 start of prednisone taper , improvement 09/2017 after end of steroid taper recurrence of diarrhea despite weekly Humira, weight loss and mild abdominal pain.  10/2017- 09/2018 participating in etrolizumab trial without significant success 10/2018 start ustekinumab 05/2019 mild-moderate disease activity.      Montreal Classification: [id number]     Crohn's disease: The patient has currently around 3 bowel movements , overall improvbed since Colestid was started. The bloating is also improved. However in contrast her labs look worse with increase of CRP and decrease of albumin and new onset of mild anemia, which all may be due to the fact that prednisone was stopped. I will now increase the Stelara to 4 weekly dosing if approved and after visit with neurosurgeon.     Abdominal pain: No significant findings on abdominal exam. Urin test equivocal. I am wondering if she is starting to flare given the lab values.     Anda Kraft Index for Crohn's Disease  General Well Being: 1 = Slightly below par  Abdominal Pain:  1 = mild  Number of Liquid Stools Per day: 3  Abdominal Mass:  0 = None  Number of Extraintestinal Manifestations:  0   1 point each for: 1. Arthritis/arthralgias, 2. Iritis/uveitis, 3. Active perianal dz, 4. Other active fistula, 5. Erythema nodosum or pyoderma, 6. Other    Total HBI Score (0-25):  5     Score:  Remission < 5;  Mild dz 5-7;  Moderate dz 8-16;  Severe > 16      Back pain: MRI performed outside shows some mild disc bulging in the region of L4-L5 and S1.  T  There were no inflammatory changes. She received now 3 steroid injections and see a neurosurgeon next week    Labs: CRP mildly up, albumin decreased mild anemia. Urine analysis inidcative for UTI, will start 10 days Cipro which will also treat possible SIBO, which may cause the lower albumin.     B12 deficiency: Patient is on B12 sublingual substitution and B12 is now high (05/2019)    Thyroid supplementation: Based on labs in 05/2019  Patient may be over substituted and I recommended to contact her endocrinologist.     Bone health: Vitamin D normal (54; 05/2019)    Health maintenance: Prevnar 2015 and 2016. Pneumovax 2016.  Shingrix completed.  Currently between first and second Covid vaccine    Portions of this record have been created using Scientist, clinical (histocompatibility and immunogenetics). Dictation errors have been sought, but may not have been identified and corrected        PLAN:  There are no Patient Instructions on file for this visit.      DIAGNOSTIC STUDIES:  I have reviewed all pertinent diagnostic studies, including:    Colonoscopy 10/2018  Patent end-to-side ileo-colonic anastomosis,                        characterized by healthy appearing mucosa.                       -  Multiple ulcers in the terminal ileum. - Simple Endoscopic Score for Crohn's Disease: 8,                        mucosal inflammatory changes secondary to Crohn's                        disease with ileitis.                       - The rectum, splenic flexure and transverse colon are                        normal.                       - Diverticulosis in the sigmoid colon and in the                        descending colon.        Laboratory results    All Labs  No visits with results within 1 Week(s) from this visit.   Latest known visit with results is:   Office Visit on 08/01/2019   Component Date Value Ref Range Status   ??? Sodium 08/01/2019 142  135 - 145 mmol/L Final   ??? Potassium 08/01/2019 3.4* 3.5 - 5.1 mmol/L Final   ??? Chloride 08/01/2019 110* 98 - 107 mmol/L Final   ??? Anion Gap 08/01/2019 7  3 - 11 mmol/L Final   ??? CO2 08/01/2019 25.1  20.0 - 31.0 mmol/L Final   ??? BUN 08/01/2019 11  9 - 23 mg/dL Final   ??? Creatinine 08/01/2019 0.66  0.60 - 1.10 mg/dL Final   ??? BUN/Creatinine Ratio 08/01/2019 17   Final   ??? EGFR CKD-EPI Non-African American,* 08/01/2019 90  mL/min/1.73m2 Final   ??? EGFR CKD-EPI African American, Fem* 08/01/2019 >90  mL/min/1.66m2 Final   ??? Glucose 08/01/2019 100  70 - 179 mg/dL Final   ??? Calcium 16/12/9602 9.2  8.7 - 10.4 mg/dL Final   ??? Albumin 54/11/8117 3.1* 3.4 - 5.0 g/dL Final   ??? Total Protein 08/01/2019 6.2  5.7 - 8.2 g/dL Final   ??? Total Bilirubin 08/01/2019 0.6  0.3 - 1.2 mg/dL Final   ??? AST 14/78/2956 18  <34 U/L Final   ??? ALT 08/01/2019 14  10 - 49 U/L Final   ??? Alkaline Phosphatase 08/01/2019 109  46 - 116 U/L Final   ??? CRP 08/01/2019 12.0* <=10.0 mg/L Final   ??? WBC 08/01/2019 8.5  3.5 - 10.5 10*9/L Final   ??? RBC 08/01/2019 3.76* 3.90 - 5.03 10*12/L Final   ??? HGB 08/01/2019 11.6* 12.0 - 15.5 g/dL Final   ??? HCT 21/30/8657 34.9* 35.0 - 44.0 % Final   ??? MCV 08/01/2019 92.6  82.0 - 98.0 fL Final   ??? MCH 08/01/2019 30.8  26.0 - 34.0 pg Final   ??? MCHC 08/01/2019 33.2  30.0 - 36.0 g/dL Final   ??? RDW 84/69/6295 14.8  12.0 - 15.0 % Final   ??? MPV 08/01/2019 6.6* 7.0 - 10.0 fL Final   ??? Platelet 08/01/2019 276  150 - 450 10*9/L Final   ??? nRBC 08/01/2019 0  <=4 /100 WBCs Final   ??? Neutrophils % 08/01/2019 71.4  % Final   ??? Lymphocytes % 08/01/2019 17.9  % Final   ???  Monocytes % 08/01/2019 7.4  % Final   ??? Eosinophils % 08/01/2019 2.4  % Final   ??? Basophils % 08/01/2019 0.9  % Final   ??? Absolute Neutrophils 08/01/2019 6.0  1.7 - 7.7 10*9/L Final   ??? Absolute Lymphocytes 08/01/2019 1.5  0.7 - 4.0 10*9/L Final   ??? Absolute Monocytes 08/01/2019 0.6  0.1 - 1.0 10*9/L Final   ??? Absolute Eosinophils 08/01/2019 0.2  0.0 - 0.7 10*9/L Final   ??? Absolute Basophils 08/01/2019 0.1  0.0 - 0.1 10*9/L Final   ??? Color, UA 08/01/2019 Yellow   Final   ??? Clarity, UA 08/01/2019 Clear   Final   ??? Specific Gravity, UA 08/01/2019 >=1.030  1.005 - 1.030 Final   ??? pH, UA 08/01/2019 5.5  5.0 - 9.0 Final   ??? Leukocyte Esterase, UA 08/01/2019 Trace* Negative Final   ??? Nitrite, UA 08/01/2019 Negative  Negative Final   ??? Protein, UA 08/01/2019 Negative  Negative Final   ??? Glucose, UA 08/01/2019 Negative  Negative Final   ??? Ketones, UA 08/01/2019 Negative  Negative Final   ??? Urobilinogen, UA 08/01/2019 0.2 mg/dL  0.2 - 2.0 mg/dL Final   ??? Bilirubin, UA 08/01/2019 Negative  Negative Final   ??? Blood, UA 08/01/2019 Negative  Negative Final   ??? RBC, UA 08/01/2019 <1  <4 /HPF Final   ??? WBC, UA 08/01/2019 <1  0 - 5 /HPF Final   ??? Squam Epithel, UA 08/01/2019 2  0 - 5 /HPF Final   ??? Bacteria, UA 08/01/2019 Rare* None Seen /HPF Final   ??? Ca Oxalate Crystal, UA 08/01/2019 Many  /HPF Final   ??? Ferritin 08/01/2019 77.1  7.3 - 270.7 ng/mL Final

## 2020-03-09 ENCOUNTER — Institutional Professional Consult (permissible substitution): Admit: 2020-03-09 | Discharge: 2020-03-10 | Payer: MEDICARE | Attending: Gastroenterology | Primary: Gastroenterology

## 2020-03-09 DIAGNOSIS — E611 Iron deficiency: Principal | ICD-10-CM

## 2020-03-09 DIAGNOSIS — Z79899 Other long term (current) drug therapy: Principal | ICD-10-CM

## 2020-03-09 DIAGNOSIS — K50019 Crohn's disease of small intestine with unspecified complications: Principal | ICD-10-CM

## 2020-03-09 DIAGNOSIS — D84821 Immunosuppression due to drug therapy (CMS-HCC): Principal | ICD-10-CM

## 2020-03-09 DIAGNOSIS — E559 Vitamin D deficiency, unspecified: Principal | ICD-10-CM

## 2020-03-09 MED ORDER — COLESTIPOL 1 GRAM TABLET
ORAL_TABLET | Freq: Two times a day (BID) | ORAL | 0 refills | 90 days | Status: CP
Start: 2020-03-09 — End: 2020-06-07

## 2020-03-09 MED ORDER — PNEUMOVAX-23 25 MCG/0.5 ML INJECTION SOLUTION
Freq: Once | INTRAMUSCULAR | 0 refills | 1.00000 days | Status: CP
Start: 2020-03-09 — End: 2020-03-09

## 2020-03-09 NOTE — Unmapped (Signed)
Gastroenterology Return Visit Note      HISTORY OF PRESENT ILLNESS: This is a 70 y.o. year old female with a history of Crohn's disease as outlined below. From CD aspect stable, 4 pound weight gain, labout 3 or 4 bowel movements daily  since start of Colestid 1 tablet before each meal.     She reports currently sinus infection, on antibiotics  PAST MEDICAL HISTORY:    Past Medical History:   Diagnosis Date   ??? Crohn's disease (CMS-HCC)    ??? Disease of thyroid gland    ??? Hypertension        PAST SURGICAL HISTORY:    Past Surgical History:   Procedure Laterality Date   ??? ABDOMINAL SURGERY      colon resection   ??? BUNIONECTOMY Left     with pins   ??? COLON SURGERY      resection   ??? FOOT SURGERY Left     bunionectomy; pins placed   ??? left foot surgery     ??? PR COLONOSCOPY FLX DX W/COLLJ SPEC WHEN PFRMD N/A 08/03/2012    Procedure: COLONOSCOPY, FLEXIBLE, PROXIMAL TO SPLENIC FLEXURE; DIAGNOSTIC, W/WO COLLECTION SPECIMEN BY BRUSH OR WASH;  Surgeon: Trula Slade, MD;  Location: [facility];  Service: Gastroenterology   ??? PR COLONOSCOPY FLX DX W/COLLJ SPEC WHEN PFRMD N/A 10/24/2014    Procedure: COLONOSCOPY, FLEXIBLE, PROXIMAL TO SPLENIC FLEXURE; DIAGNOSTIC, W/WO COLLECTION SPECIMEN BY BRUSH OR WASH;  Surgeon: Trula Slade, MD;  Location: [facility];  Service: Gastroenterology   ??? PR COLONOSCOPY FLX DX W/COLLJ SPEC WHEN PFRMD N/A 04/08/2016    Procedure: COLONOSCOPY, FLEXIBLE, PROXIMAL TO SPLENIC FLEXURE; DIAGNOSTIC, W/WO COLLECTION SPECIMEN BY BRUSH OR WASH;  Surgeon: Modena Nunnery, MD;  Location: [facility];  Service: Gastroenterology   ??? PR COLONOSCOPY FLX DX W/COLLJ SPEC WHEN PFRMD N/A 12/27/2017    Procedure: COLONOSCOPY, FLEXIBLE, PROXIMAL TO SPLENIC FLEXURE; DIAGNOSTIC, W/WO COLLECTION SPECIMEN BY BRUSH OR WASH;  Surgeon: Rona Ravens, MD;  Location: [facility];  Service: Gastroenterology   ??? PR COLONOSCOPY FLX DX W/COLLJ SPEC WHEN PFRMD N/A 05/25/2018    Procedure: COLONOSCOPY, FLEXIBLE, PROXIMAL TO SPLENIC FLEXURE; DIAGNOSTIC, W/WO COLLECTION SPECIMEN BY BRUSH OR WASH;  Surgeon: Modena Nunnery, MD;  Location: [facility];  Service: Gastroenterology   ??? PR COLONOSCOPY FLX DX W/COLLJ SPEC WHEN PFRMD N/A 10/09/2018    Procedure: COLONOSCOPY, FLEXIBLE, PROXIMAL TO SPLENIC FLEXURE; DIAGNOSTIC, W/WO COLLECTION SPECIMEN BY BRUSH OR WASH;  Surgeon: Rona Ravens, MD;  Location: [facility];  Service: Gastroenterology   ??? PR COLONOSCOPY W/BIOPSY SINGLE/MULTIPLE N/A 01/26/2018    Procedure: COLONOSCOPY, FLEXIBLE, PROXIMAL TO SPLENIC FLEXURE; WITH BIOPSY, SINGLE OR MULTIPLE;  Surgeon: Modena Nunnery, MD;  Location: [facility];  Service: Gastroenterology         CURRENT MEDICATIONS:      Current Outpatient Medications:   ???  calcium-vitamin D (CALCIUM-VITAMIN D) 500 mg(1,250mg ) -200 unit per tablet, Take 1 tablet by mouth daily. , Disp: , Rfl:   ???  colestipoL (COLESTID) 1 gram tablet, Take 2 tablets (2 g total) by mouth Two (2) times a day. Take 1-2 tablets approx. 10 minutes  before large meals. Take other medications 2-3 hours apart., Disp: 360 tablet, Rfl: 0  ???  cyanocobalamin 2000 MCG tablet, Take 2,000 mcg by mouth daily., Disp: , Rfl:   ???  diphenoxylate-atropine (LOMOTIL) 2.5-0.025 mg per  tablet, Take 1 tablet by mouth 4 (four) times a day as needed for diarrhea for up to 30 doses., Disp: 30 tablet, Rfl: 0  ???  levothyroxine (SYNTHROID) 25 MCG tablet, Take 25 mcg by mouth daily at 0600. , Disp: , Rfl:   ???  metoprolol succinate (TOPROL-XL) 50 MG 24 hr tablet, Take 50 mg by mouth daily., Disp: , Rfl:   ???  multivitamin capsule, Take 1 capsule by mouth daily., Disp: , Rfl:   ???  pneumococcal polysacchride, 23-valps, (PNEUMOVAX-23) 25 mcg/0.5 mL injection, Inject 0.5 mL into the muscle once for 1 dose., Disp: 0.5 mL, Rfl: 0  ???  ustekinumab (STELARA) 45 mg/0.5 mL Soln, 90mg  subcutaneous Every 8 weeks, filled with [facility] [id number], Disp: 1 mL, Rfl: 2       VITAL SIGNS:  BP Readings from Last 4 Encounters:   08/01/19 139/69   05/16/19 139/64   11/27/18 160/67   10/09/18 109/59      Wt Readings from Last 4 Encounters:   08/01/19 50.3 kg (111 lb)   05/16/19 49.4 kg (109 lb)   11/27/18 51.1 kg (112 lb 9.6 oz)   10/09/18 50.8 kg (112 lb)        BMI: Estimated body mass index is 18.47 kg/m?? as calculated from the following:    Height as of 10/09/18: 165.1 cm (5' 5).    Weight as of 08/01/19: 50.3 kg (111 lb).    BSA: Estimated body surface area is 1.52 meters squared as calculated from the following:    Height as of 10/09/18: 165.1 cm (5' 5).    Weight as of 08/01/19: 50.3 kg (111 lb).      PHYSICAL EXAM:  Televisit      ASSESSMENT:    1. Crohns disease with Hx of small bowel resection due to stricturing and fistulizing luminal disease in 2008 and recurrence at anastomosis in 02/2010. Start of Humira in 04/2010       Course of Disease:  First diagnosis of Crohn's disease in approximately the late 1980s. At that time, she had cramping abdominal pain and diarrhea. She was started on a steroid taper and maintained on sulfasalazine. Remission until approximately 2004. In 2004 start of abdominal bloating, epigastric pain, and intermittent loose stools. 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 Start of ciprofloxacin therapy approximately in 2005 as well as Entocort and of Pentasa 4 g daily. Approximately in the beginning of 2007 decrease of entocort to 6 mg per day, however, still abdominal pain, bloating. 05/08/06 Ileocecal resection with ileostomy and repair of ileal fistula as well as takedown and repair of ileal sigmoid fistula with sigmoid colectomy (About 18 inches of small bowel and 4 inches of cecum/colon were resected). 06/22/06,ileostomy takedown with ileal colonic anastomosis. 11/2006 remission. Intolerance of 6-MP therapy (joint pain, hair loss, nausea).12/08Start of sulfasalazine due to joint pain. 5/09-10/2009 remission. 04/2010, start of Humira. 09/2010 -02/2012 clinical remission. 03/2014 mild symptoms in the settings of missing a dose of Humira 07/2014 remission but another interruption of Humira therapy for 4 weeks due to foot surgery 01/2015 increase to weekly Humira therapy in the setting of I3 recurrence 01/2017 continues weight loss on weekly Humira therapy 02/2017 CT showing several inflammatory small bowel segments 03/2017 start of prednisone taper , improvement 09/2017 after end of steroid taper recurrence of diarrhea despite weekly Humira, weight loss and mild abdominal pain.  10/2017- 09/2018 participating in etrolizumab trial without significant success 10/2018  start ustekinumab 05/2019 mild-moderate disease activity. SIBO, improvment after Cipro1/2022 stable, mild weight gain       Montreal Classification: [id number]     Crohn's disease: On 8-weekly Stelara (original plan was to increase , but she stabilized after starting colestid).  The patient has currently around 3 bowel movements (1-2 more liquid) , overall improvbed since Colestid was started. Should plan for a colonoscopy later in 2022 after SES-CD 9 in 10/2018 with small bowel involvement at anastomosis    Abdominal pain: Not present    Anda Kraft Index for Crohn's Disease  General Well Being: 0 = Very well  Abdominal Pain:  0 = none  Number of Liquid Stools Per day: 2  Abdominal Mass:  0 = None  Number of Extraintestinal Manifestations:  0   1 point each for: 1. Arthritis/arthralgias, 2. Iritis/uveitis, 3. Active perianal dz, 4. Other active fistula, 5. Erythema nodosum or pyoderma, 6. Other    Total HBI Score (0-25):  2     Score:  Remission < 5;  Mild dz 5-7;  Moderate dz 8-16;  Severe > 16      Back pain: MRI performed outside shows some mild disc bulging in the region of L4-L5 and S1.  Currently no complaint    Labs: Planned draw with [facility]    B12 deficiency: Patient was on B12 sublingual but stopped B12, recommeneded to restart      Bone health: Vitamin D normal (54; 05/2019)  Recheck with next labs    Health maintenance: Prevnar 2015 and 2016. Pneumovax 2016.  Shingrix completed.  Got COVID booster. Recommendation for Pneumovax locally    Portions of this record have been created using Scientist, clinical (histocompatibility and immunogenetics). Dictation errors have been sought, but may not have been identified and corrected        The patient reports they are currently: at home. I spent 21 minutes on the phone visit with the patient on the date of service. I spent an additional 15 minutes on pre- and post-visit activities on the date of service.     The patient was not located and I was located within 250 yards of a hospital based location during the phone visit. The patient was physically located in [place] or a state in which I am permitted to provide care. The patient and/or parent/guardian understood that s/he may incur co-pays and cost sharing, and agreed to the telemedicine visit. The visit was reasonable and appropriate under the circumstances given the patient's presentation at the time.    The patient and/or parent/guardian has been advised of the potential risks and limitations of this mode of treatment (including, but not limited to, the absence of in-person examination) and has agreed to be treated using telemedicine. The patient's/patient's family's questions regarding telemedicine have been answered.    If the visit was completed in an ambulatory setting, the patient and/or parent/guardian has also been advised to contact their provider???s office for worsening conditions, and seek emergency medical treatment and/or call 911 if the patient deems either necessary.            PLAN:  Patient Instructions   Restart Vitamine B12    Continue Colestid 1-2 tablets daily before breakfast or a large meal    Get Pneumovax vaccination    Get labs with [facility]        DIAGNOSTIC STUDIES:  I have reviewed all pertinent diagnostic studies, including:    Colonoscopy 10/2018  Patent end-to-side ileo-colonic anastomosis,  characterized by healthy appearing mucosa.                       - Multiple ulcers in the terminal ileum.                       - Simple Endoscopic Score for Crohn's Disease: 8,                        mucosal inflammatory changes secondary to Crohn's                        disease with ileitis.                       - The rectum, splenic flexure and transverse colon are                        normal.                       - Diverticulosis in the sigmoid colon and in the                        descending colon.        Laboratory results    All Labs  No visits with results within 1 Week(s) from this visit.   Latest known visit with results is:   Office Visit on 08/01/2019   Component Date Value Ref Range Status   ??? Sodium 08/01/2019 142  135 - 145 mmol/L Final   ??? Potassium 08/01/2019 3.4* 3.5 - 5.1 mmol/L Final   ??? Chloride 08/01/2019 110* 98 - 107 mmol/L Final   ??? Anion Gap 08/01/2019 7  3 - 11 mmol/L Final   ??? CO2 08/01/2019 25.1  20.0 - 31.0 mmol/L Final   ??? BUN 08/01/2019 11  9 - 23 mg/dL Final   ??? Creatinine 08/01/2019 0.66  0.60 - 1.10 mg/dL Final   ??? BUN/Creatinine Ratio 08/01/2019 17   Final   ??? EGFR CKD-EPI Non-African American,* 08/01/2019 90  mL/min/1.14m2 Final   ??? EGFR CKD-EPI African American, Fem* 08/01/2019 >90  mL/min/1.54m2 Final   ??? Glucose 08/01/2019 100  70 - 179 mg/dL Final   ??? Calcium 16/12/9602 9.2  8.7 - 10.4 mg/dL Final   ??? Albumin 54/11/8117 3.1* 3.4 - 5.0 g/dL Final   ??? Total Protein 08/01/2019 6.2  5.7 - 8.2 g/dL Final   ??? Total Bilirubin 08/01/2019 0.6  0.3 - 1.2 mg/dL Final   ??? AST 14/78/2956 18  <34 U/L Final   ??? ALT 08/01/2019 14  10 - 49 U/L Final   ??? Alkaline Phosphatase 08/01/2019 109  46 - 116 U/L Final   ??? CRP 08/01/2019 12.0* <=10.0 mg/L Final   ??? WBC 08/01/2019 8.5  3.5 - 10.5 10*9/L Final   ??? RBC 08/01/2019 3.76* 3.90 - 5.03 10*12/L Final   ??? HGB 08/01/2019 11.6* 12.0 - 15.5 g/dL Final   ??? HCT 21/30/8657 34.9* 35.0 - 44.0 % Final   ??? MCV 08/01/2019 92.6  82.0 - 98.0 fL Final   ??? MCH 08/01/2019 30.8  26.0 - 34.0 pg Final   ??? MCHC 08/01/2019 33.2  30.0 - 36.0 g/dL Final   ??? RDW 84/69/6295 14.8  12.0 - 15.0 % Final   ??? MPV 08/01/2019 6.6* 7.0 -  10.0 fL Final   ??? Platelet 08/01/2019 276  150 - 450 10*9/L Final   ??? nRBC 08/01/2019 0  <=4 /100 WBCs Final   ??? Neutrophils % 08/01/2019 71.4  % Final   ??? Lymphocytes % 08/01/2019 17.9  % Final   ??? Monocytes % 08/01/2019 7.4  % Final   ??? Eosinophils % 08/01/2019 2.4  % Final   ??? Basophils % 08/01/2019 0.9  % Final   ??? Absolute Neutrophils 08/01/2019 6.0  1.7 - 7.7 10*9/L Final   ??? Absolute Lymphocytes 08/01/2019 1.5  0.7 - 4.0 10*9/L Final   ??? Absolute Monocytes 08/01/2019 0.6  0.1 - 1.0 10*9/L Final   ??? Absolute Eosinophils 08/01/2019 0.2  0.0 - 0.7 10*9/L Final   ??? Absolute Basophils 08/01/2019 0.1  0.0 - 0.1 10*9/L Final   ??? Color, UA 08/01/2019 Yellow   Final   ??? Clarity, UA 08/01/2019 Clear   Final   ??? Specific Gravity, UA 08/01/2019 >=1.030  1.005 - 1.030 Final   ??? pH, UA 08/01/2019 5.5  5.0 - 9.0 Final   ??? Leukocyte Esterase, UA 08/01/2019 Trace* Negative Final   ??? Nitrite, UA 08/01/2019 Negative  Negative Final   ??? Protein, UA 08/01/2019 Negative  Negative Final   ??? Glucose, UA 08/01/2019 Negative  Negative Final   ??? Ketones, UA 08/01/2019 Negative  Negative Final   ??? Urobilinogen, UA 08/01/2019 0.2 mg/dL  0.2 - 2.0 mg/dL Final   ??? Bilirubin, UA 08/01/2019 Negative  Negative Final   ??? Blood, UA 08/01/2019 Negative  Negative Final   ??? RBC, UA 08/01/2019 <1  <4 /HPF Final   ??? WBC, UA 08/01/2019 <1  0 - 5 /HPF Final   ??? Squam Epithel, UA 08/01/2019 2  0 - 5 /HPF Final   ??? Bacteria, UA 08/01/2019 Rare* None Seen /HPF Final   ??? Ca Oxalate Crystal, UA 08/01/2019 Many  /HPF Final   ??? Ferritin 08/01/2019 77.1  7.3 - 270.7 ng/mL Final

## 2020-03-09 NOTE — Unmapped (Signed)
Restart Vitamine B12    Continue Colestid 1-2 tablets daily before breakfast or a large meal    Get Pneumovax vaccination    Get labs with [facility]

## 2020-04-07 LAB — COMPREHENSIVE METABOLIC PANEL
A/G RATIO: 1.8 (ref 1.2–2.2)
ALBUMIN: 4.2 g/dL (ref 3.8–4.8)
ALKALINE PHOSPHATASE: 115 IU/L (ref 44–121)
ALT (SGPT): 11 IU/L (ref 0–32)
AST (SGOT): 17 IU/L (ref 0–40)
BILIRUBIN TOTAL: 0.6 mg/dL (ref 0.0–1.2)
BLOOD UREA NITROGEN: 12 mg/dL (ref 8–27)
BUN / CREAT RATIO: 18 (ref 12–28)
CALCIUM: 9.4 mg/dL (ref 8.7–10.3)
CHLORIDE: 105 mmol/L (ref 96–106)
CO2: 23 mmol/L (ref 20–29)
CREATININE: 0.65 mg/dL (ref 0.57–1.00)
GFR MDRD AF AMER: 104 mL/min/{1.73_m2}
GFR MDRD NON AF AMER: 90 mL/min/{1.73_m2}
GLOBULIN, TOTAL: 2.4 g/dL (ref 1.5–4.5)
GLUCOSE: 74 mg/dL (ref 65–99)
POTASSIUM: 3.6 mmol/L (ref 3.5–5.2)
SODIUM: 142 mmol/L (ref 134–144)
TOTAL PROTEIN: 6.6 g/dL (ref 6.0–8.5)

## 2020-04-07 LAB — CBC W/ DIFFERENTIAL
BANDED NEUTROPHILS ABSOLUTE COUNT: 0 10*3/uL (ref 0.0–0.1)
BASOPHILS ABSOLUTE COUNT: 0.1 10*3/uL (ref 0.0–0.2)
BASOPHILS RELATIVE PERCENT: 1 %
EOSINOPHILS ABSOLUTE COUNT: 0.1 10*3/uL (ref 0.0–0.4)
EOSINOPHILS RELATIVE PERCENT: 2 %
HEMATOCRIT: 37.6 % (ref 34.0–46.6)
HEMOGLOBIN: 12.7 g/dL (ref 11.1–15.9)
IMMATURE GRANULOCYTES: 0 %
LYMPHOCYTES ABSOLUTE COUNT: 1.9 10*3/uL (ref 0.7–3.1)
LYMPHOCYTES RELATIVE PERCENT: 28 %
MEAN CORPUSCULAR HEMOGLOBIN CONC: 33.8 g/dL (ref 31.5–35.7)
MEAN CORPUSCULAR HEMOGLOBIN: 30.8 pg (ref 26.6–33.0)
MEAN CORPUSCULAR VOLUME: 91 fL (ref 79–97)
MONOCYTES ABSOLUTE COUNT: 0.7 10*3/uL (ref 0.1–0.9)
MONOCYTES RELATIVE PERCENT: 10 %
NEUTROPHILS ABSOLUTE COUNT: 4.1 10*3/uL (ref 1.4–7.0)
NEUTROPHILS RELATIVE PERCENT: 59 %
PLATELET COUNT: 212 10*3/uL (ref 150–450)
RED BLOOD CELL COUNT: 4.13 x10E6/uL (ref 3.77–5.28)
RED CELL DISTRIBUTION WIDTH: 12.8 % (ref 11.7–15.4)
WHITE BLOOD CELL COUNT: 6.9 10*3/uL (ref 3.4–10.8)

## 2020-04-07 LAB — C-REACTIVE PROTEIN: C-REACTIVE PROTEIN: 2 mg/L (ref 0–10)

## 2020-04-07 LAB — IRON & TIBC
IRON SATURATION: 25 % (ref 15–55)
IRON: 84 ug/dL (ref 27–139)
TOTAL IRON BINDING CAPACITY: 342 ug/dL (ref 250–450)
UNSATURATED IRON BINDING CAPACITY: 258 ug/dL (ref 118–369)

## 2020-04-07 LAB — VITAMIN D 25 HYDROXY: VITAMIN D 25-HYDROXY: 33.2 ng/mL (ref 30.0–100.0)

## 2020-04-07 LAB — FERRITIN: FERRITIN: 92 ng/mL (ref 15–150)

## 2020-06-02 NOTE — Unmapped (Signed)
Follow-up call today at 9.50 am week 92 PML, patient doing well, answer is No to all questions below    1. Have you been  experiencing any  persistent difficulty with  your vision such as loss  of vision or double  vision? Have you been  having trouble with  reading?  ??  2. Have you been  experiencing any  persistent difficulty  speaking or having your  speech understood by  others?  ??  3. Have you been  experiencing any  persistent weakness in  an arm or a leg?  ??  4. Have you noticed  yourself regularly  bumping into things or  having difficulty writing?  ??  5. Have you regularly  been experiencing  difficulty understanding  others?  ??  6. Have you had  persistent problems with  your memory or thinking?  ??  7. Have you been  experiencing any  persistent numbness or  other loss of sensation?

## 2021-02-08 DIAGNOSIS — K50019 Crohn's disease of small intestine with unspecified complications: Principal | ICD-10-CM

## 2021-02-09 NOTE — Unmapped (Signed)
Stelara refills authorized. Follow up appt needed with Dr. Gwenith Spitz, patient will be contacted by [facility] central scheduling to schedule

## 2021-04-09 DIAGNOSIS — K50019 Crohn's disease of small intestine with unspecified complications: Principal | ICD-10-CM

## 2021-04-09 NOTE — Unmapped (Signed)
Stelara refills sent to [id number]. [place] follow planned for 04/15/2021.

## 2021-04-15 ENCOUNTER — Telehealth: Admit: 2021-04-15 | Payer: MEDICARE | Attending: Gastroenterology | Primary: Gastroenterology

## 2021-04-15 MED ORDER — CIPROFLOXACIN 500 MG TABLET
ORAL_TABLET | Freq: Two times a day (BID) | ORAL | 0 refills | 10.00000 days | Status: CP
Start: 2021-04-15 — End: 2021-05-15

## 2021-04-15 NOTE — Unmapped (Signed)
Gastroenterology Return Visit Note      HISTORY OF PRESENT ILLNESS: This is a 71 y.o. year old female with a history of Crohn's disease as outlined below. From CD aspect stable,weight stable around 112, bowel frequency incred to 4-5 from 3-4. 1/2 tablet  Colestid / day. Higher dosing results in more bloating.     PAST MEDICAL HISTORY:    Past Medical History:   Diagnosis Date   ??? Crohn's disease (CMS-HCC)    ??? Disease of thyroid gland    ??? Hypertension        PAST SURGICAL HISTORY:    Past Surgical History:   Procedure Laterality Date   ??? ABDOMINAL SURGERY      colon resection   ??? BUNIONECTOMY Left     with pins   ??? COLON SURGERY      resection   ??? FOOT SURGERY Left     bunionectomy; pins placed   ??? left foot surgery     ??? PR COLONOSCOPY FLX DX W/COLLJ SPEC WHEN PFRMD N/A 08/03/2012    Procedure: COLONOSCOPY, FLEXIBLE, PROXIMAL TO SPLENIC FLEXURE; DIAGNOSTIC, W/WO COLLECTION SPECIMEN BY BRUSH OR WASH;  Surgeon: Trula Slade, MD;  Location: [facility];  Service: Gastroenterology   ??? PR COLONOSCOPY FLX DX W/COLLJ SPEC WHEN PFRMD N/A 10/24/2014    Procedure: COLONOSCOPY, FLEXIBLE, PROXIMAL TO SPLENIC FLEXURE; DIAGNOSTIC, W/WO COLLECTION SPECIMEN BY BRUSH OR WASH;  Surgeon: Trula Slade, MD;  Location: [facility];  Service: Gastroenterology   ??? PR COLONOSCOPY FLX DX W/COLLJ SPEC WHEN PFRMD N/A 04/08/2016    Procedure: COLONOSCOPY, FLEXIBLE, PROXIMAL TO SPLENIC FLEXURE; DIAGNOSTIC, W/WO COLLECTION SPECIMEN BY BRUSH OR WASH;  Surgeon: Modena Nunnery, MD;  Location: [facility];  Service: Gastroenterology   ??? PR COLONOSCOPY FLX DX W/COLLJ SPEC WHEN PFRMD N/A 12/27/2017    Procedure: COLONOSCOPY, FLEXIBLE, PROXIMAL TO SPLENIC FLEXURE; DIAGNOSTIC, W/WO COLLECTION SPECIMEN BY BRUSH OR WASH;  Surgeon: Rona Ravens, MD;  Location: [facility];  Service: Gastroenterology   ??? PR COLONOSCOPY FLX DX W/COLLJ SPEC WHEN PFRMD N/A 05/25/2018 Procedure: COLONOSCOPY, FLEXIBLE, PROXIMAL TO SPLENIC FLEXURE; DIAGNOSTIC, W/WO COLLECTION SPECIMEN BY BRUSH OR WASH;  Surgeon: Modena Nunnery, MD;  Location: [facility];  Service: Gastroenterology   ??? PR COLONOSCOPY FLX DX W/COLLJ SPEC WHEN PFRMD N/A 10/09/2018    Procedure: COLONOSCOPY, FLEXIBLE, PROXIMAL TO SPLENIC FLEXURE; DIAGNOSTIC, W/WO COLLECTION SPECIMEN BY BRUSH OR WASH;  Surgeon: Rona Ravens, MD;  Location: [facility];  Service: Gastroenterology   ??? PR COLONOSCOPY W/BIOPSY SINGLE/MULTIPLE N/A 01/26/2018    Procedure: COLONOSCOPY, FLEXIBLE, PROXIMAL TO SPLENIC FLEXURE; WITH BIOPSY, SINGLE OR MULTIPLE;  Surgeon: Modena Nunnery, MD;  Location: [facility];  Service: Gastroenterology         CURRENT MEDICATIONS:      Current Outpatient Medications:   ???  calcium-vitamin D (CALCIUM-VITAMIN D) 500 mg(1,250mg ) -200 unit per tablet, Take 1 tablet by mouth daily. , Disp: , Rfl:   ???  ciprofloxacin HCl (CIPRO) 500 MG tablet, Take 1 tablet (500 mg total) by mouth Two (2) times a day., Disp: 20 tablet, Rfl: 0  ???  colestipoL (COLESTID) 1 gram tablet, Take 2 tablets (2 g total) by mouth Two (2) times a day. Take 1-2 tablets approx. 10 minutes  before large meals. Take other medications 2-3 hours apart., Disp: 360 tablet, Rfl: 0  ???  cyanocobalamin 2000 MCG tablet, Take  2,000 mcg by mouth daily., Disp: , Rfl:   ???  diphenoxylate-atropine (LOMOTIL) 2.5-0.025 mg per tablet, Take 1 tablet by mouth 4 (four) times a day as needed for diarrhea for up to 30 doses., Disp: 30 tablet, Rfl: 0  ???  levothyroxine (SYNTHROID) 25 MCG tablet, Take 25 mcg by mouth daily at 0600. , Disp: , Rfl:   ???  metoprolol succinate (TOPROL-XL) 50 MG 24 hr tablet, Take 50 mg by mouth daily., Disp: , Rfl:   ???  multivitamin capsule, Take 1 capsule by mouth daily., Disp: , Rfl:   ???  ustekinumab (STELARA) 45 mg/0.5 mL Soln, 90mg  subcutaneous Every 8 weeks, filled with [facility] [id number], Disp: 1 mL, Rfl: 2       VITAL SIGNS:  BP Readings from Last 4 Encounters:   08/01/19 139/69   05/16/19 139/64   11/27/18 160/67   10/09/18 109/59      Wt Readings from Last 4 Encounters:   08/01/19 50.3 kg (111 lb)   05/16/19 49.4 kg (109 lb)   11/27/18 51.1 kg (112 lb 9.6 oz)   10/09/18 50.8 kg (112 lb)        BMI: Estimated body mass index is 18.47 kg/m?? as calculated from the following:    Height as of 10/09/18: 165.1 cm (5' 5).    Weight as of 08/01/19: 50.3 kg (111 lb).    BSA: Estimated body surface area is 1.52 meters squared as calculated from the following:    Height as of 10/09/18: 165.1 cm (5' 5).    Weight as of 08/01/19: 50.3 kg (111 lb).      PHYSICAL EXAM:  Televisit      ASSESSMENT:    1. Crohns disease with Hx of small bowel resection due to stricturing and fistulizing luminal disease in 2008 and recurrence at anastomosis in 02/2010. Start of Humira in 04/2010       Course of Disease:  First diagnosis of Crohn's disease in approximately the late 1980s. At that time, she had cramping abdominal pain and diarrhea. She was started on a steroid taper and maintained on sulfasalazine. Remission until approximately 2004. In 2004 start of abdominal bloating, epigastric pain, and intermittent loose stools. 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 Start of ciprofloxacin therapy approximately in 2005 as well as Entocort and of Pentasa 4 g daily. Approximately in the beginning of 2007 decrease of entocort to 6 mg per day, however, still abdominal pain, bloating. 05/08/06 Ileocecal resection with ileostomy and repair of ileal fistula as well as takedown and repair of ileal sigmoid fistula with sigmoid colectomy (About 18 inches of small bowel and 4 inches of cecum/colon were resected). 06/22/06,ileostomy takedown with ileal colonic anastomosis. 11/2006 remission. Intolerance of 6-MP therapy (joint pain, hair loss, nausea).12/08Start of sulfasalazine due to joint pain. 5/09-10/2009 remission. 04/2010, start of Humira. 09/2010 -02/2012 clinical remission. 03/2014 mild symptoms in the settings of missing a dose of Humira 07/2014 remission but another interruption of Humira therapy for 4 weeks due to foot surgery 01/2015 increase to weekly Humira therapy in the setting of I3 recurrence 01/2017 continues weight loss on weekly Humira therapy 02/2017 CT showing several inflammatory small bowel segments 03/2017 start of prednisone taper , improvement 09/2017 after end of steroid taper recurrence of diarrhea despite weekly Humira, weight loss and mild abdominal pain.  10/2017- 09/2018 participating in etrolizumab trial without significant success 10/2018 start ustekinumab 05/2019 mild-moderate disease activity. SIBO, improvement after  Cipro1/2022 stable, mild weight gain       Montreal Classification: [id number]     Crohn's disease: On 8-weekly Stelara .  The patient has currently around 3 bowel movements (1-2 more liquid) , overall improved since Colestid was started, but now increased bloating. Suspicion of bacterial overgrowth, plan for 10 days of Cipro, then recheck by phone with patient and plan for colonoscopy later in 2023.     Abdominal pain: Not present    Anda Kraft Index for Crohn's Disease  General Well Being: 0 = Very well  Abdominal Pain:  0 = none  Number of Liquid Stools Per day: 4  Abdominal Mass:  0 = None  Number of Extraintestinal Manifestations:  0   1 point each for: 1. Arthritis/arthralgias, 2. Iritis/uveitis, 3. Active perianal dz, 4. Other active fistula, 5. Erythema nodosum or pyoderma, 6. Other    Total HBI Score (0-25):  4     Score:  Remission < 5;  Mild dz 5-7;  Moderate dz 8-16;  Severe > 16      Back pain: MRI performed outside shows some mild disc bulging in the region of L4-L5 and S1.  Currently no complaint    Labs: Normal albumin, no significant abnormality    B12 deficiency: Patient on B12 sublingual, recheck with next lab Bone health: Vitamin D normal (54; 05/2019)  Recheck with next labs    Health maintenance: Prevnar 2015 and 2016. Pneumovax 2016.  Shingrix completed.  Recommendation for Pneumovax locally in the past, but not completed    Portions of this record have been created using Scientist, clinical (histocompatibility and immunogenetics). Dictation errors have been sought, but may not have been identified and corrected        The patient reports they are currently: at home. I spent 13 minutes on the phone visit with the patient on the date of service. I spent an additional 15 minutes on pre- and post-visit activities on the date of service.     The patient was not located and I was located within 250 yards of a hospital based location during the phone visit. The patient was physically located in [place] or a state in which I am permitted to provide care. The patient and/or parent/guardian understood that s/he may incur co-pays and cost sharing, and agreed to the telemedicine visit. The visit was reasonable and appropriate under the circumstances given the patient's presentation at the time.    The patient and/or parent/guardian has been advised of the potential risks and limitations of this mode of treatment (including, but not limited to, the absence of in-person examination) and has agreed to be treated using telemedicine. The patient's/patient's family's questions regarding telemedicine have been answered.    If the visit was completed in an ambulatory setting, the patient and/or parent/guardian has also been advised to contact their provider???s office for worsening conditions, and seek emergency medical treatment and/or call 911 if the patient deems either necessary.            PLAN:  Patient Instructions   Plan for labs    Plan Cipro 500 mg bid for 10 days, then re-assessment        DIAGNOSTIC STUDIES:  I have reviewed all pertinent diagnostic studies, including:    Colonoscopy 10/2018  Patent end-to-side ileo-colonic anastomosis, characterized by healthy appearing mucosa.                       - Multiple ulcers in the  terminal ileum.                       - Simple Endoscopic Score for Crohn's Disease: 8,                        mucosal inflammatory changes secondary to Crohn's                        disease with ileitis.                       - The rectum, splenic flexure and transverse colon are                        normal.                       - Diverticulosis in the sigmoid colon and in the                        descending colon.        Laboratory results    All Labs  Telemedicine on 04/15/2021   Component Date Value Ref Range Status   ??? WBC 04/15/2021 8.7  3.4 - 10.8 x10E3/uL Final   ??? RBC 04/15/2021 3.88  3.77 - 5.28 x10E6/uL Final   ??? HGB 04/15/2021 12.4  11.1 - 15.9 g/dL Final   ??? HCT 96/06/5407 36.1  34.0 - 46.6 % Final   ??? MCV 04/15/2021 93  79 - 97 fL Final   ??? MCH 04/15/2021 32.0  26.6 - 33.0 pg Final   ??? MCHC 04/15/2021 34.3  31.5 - 35.7 g/dL Final   ??? RDW 81/19/1478 12.8  11.7 - 15.4 % Final   ??? Platelet 04/15/2021 243  150 - 450 x10E3/uL Final   ??? Neutrophils % 04/15/2021 63  Not Estab. % Final   ??? Lymphocytes % 04/15/2021 26  Not Estab. % Final   ??? Monocytes % 04/15/2021 8  Not Estab. % Final   ??? Eosinophils % 04/15/2021 2  Not Estab. % Final   ??? Basophils % 04/15/2021 1  Not Estab. % Final   ??? Absolute Neutrophils 04/15/2021 5.5  1.4 - 7.0 x10E3/uL Final   ??? Absolute Lymphocytes 04/15/2021 2.2  0.7 - 3.1 x10E3/uL Final   ??? Absolute Monocytes  04/15/2021 0.7  0.1 - 0.9 x10E3/uL Final   ??? Absolute Eosinophils 04/15/2021 0.1  0.0 - 0.4 x10E3/uL Final   ??? Absolute Basophils  04/15/2021 0.1  0.0 - 0.2 x10E3/uL Final   ??? Immature Granulocytes 04/15/2021 0  Not Estab. % Final   ??? Bands Absolute 04/15/2021 0.0  0.0 - 0.1 x10E3/uL Final   ??? Glucose 04/15/2021 77  70 - 99 mg/dL Final   ??? BUN 29/56/2130 14  8 - 27 mg/dL Final   ??? Creatinine 04/15/2021 0.77  0.57 - 1.00 mg/dL Final   ??? eGFR 04/15/2021 82  >59 mL/min/1.73 Final   ??? BUN/Creatinine Ratio 04/15/2021 18  12 - 28 Final   ??? Sodium 04/15/2021 139  134 - 144 mmol/L Final   ??? Potassium 04/15/2021 4.6  3.5 - 5.2 mmol/L Final   ??? Chloride 04/15/2021 105  96 - 106 mmol/L Final   ??? CO2 04/15/2021 22  20 - 29 mmol/L Final   ??? Calcium 04/15/2021 9.9  8.7 - 10.3  mg/dL Final   ??? Total Protein 04/15/2021 6.7  6.0 - 8.5 g/dL Final   ??? Albumin 16/12/9602 4.3  3.7 - 4.7 g/dL Final   ??? Globulin, Total 04/15/2021 2.4  1.5 - 4.5 g/dL Final   ??? A/G Ratio 54/11/8117 1.8  1.2 - 2.2 Final   ??? Total Bilirubin 04/15/2021 0.3  0.0 - 1.2 mg/dL Final   ??? Alkaline Phosphatase 04/15/2021 111  44 - 121 IU/L Final   ??? AST 04/15/2021 20  0 - 40 IU/L Final   ??? ALT 04/15/2021 15  0 - 32 IU/L Final   ??? CRP 04/15/2021 <1  0 - 10 mg/L Final   ??? Ferritin 04/15/2021 135  15 - 150 ng/mL Final   ??? Vit D, 25-Hydroxy 04/15/2021 30.5  30.0 - 100.0 ng/mL Final

## 2021-04-16 LAB — CBC W/ DIFFERENTIAL
BANDED NEUTROPHILS ABSOLUTE COUNT: 0 10*3/uL (ref 0.0–0.1)
BASOPHILS ABSOLUTE COUNT: 0.1 10*3/uL (ref 0.0–0.2)
BASOPHILS RELATIVE PERCENT: 1 %
EOSINOPHILS ABSOLUTE COUNT: 0.1 10*3/uL (ref 0.0–0.4)
EOSINOPHILS RELATIVE PERCENT: 2 %
HEMATOCRIT: 36.1 % (ref 34.0–46.6)
HEMOGLOBIN: 12.4 g/dL (ref 11.1–15.9)
IMMATURE GRANULOCYTES: 0 %
LYMPHOCYTES ABSOLUTE COUNT: 2.2 10*3/uL (ref 0.7–3.1)
LYMPHOCYTES RELATIVE PERCENT: 26 %
MEAN CORPUSCULAR HEMOGLOBIN CONC: 34.3 g/dL (ref 31.5–35.7)
MEAN CORPUSCULAR HEMOGLOBIN: 32 pg (ref 26.6–33.0)
MEAN CORPUSCULAR VOLUME: 93 fL (ref 79–97)
MONOCYTES ABSOLUTE COUNT: 0.7 10*3/uL (ref 0.1–0.9)
MONOCYTES RELATIVE PERCENT: 8 %
NEUTROPHILS ABSOLUTE COUNT: 5.5 10*3/uL (ref 1.4–7.0)
NEUTROPHILS RELATIVE PERCENT: 63 %
PLATELET COUNT: 243 10*3/uL (ref 150–450)
RED BLOOD CELL COUNT: 3.88 x10E6/uL (ref 3.77–5.28)
RED CELL DISTRIBUTION WIDTH: 12.8 % (ref 11.7–15.4)
WHITE BLOOD CELL COUNT: 8.7 10*3/uL (ref 3.4–10.8)

## 2021-04-16 LAB — COMPREHENSIVE METABOLIC PANEL
A/G RATIO: 1.8 (ref 1.2–2.2)
ALBUMIN: 4.3 g/dL (ref 3.7–4.7)
ALKALINE PHOSPHATASE: 111 IU/L (ref 44–121)
ALT (SGPT): 15 IU/L (ref 0–32)
AST (SGOT): 20 IU/L (ref 0–40)
BILIRUBIN TOTAL (MG/DL) IN SER/PLAS: 0.3 mg/dL (ref 0.0–1.2)
BLOOD UREA NITROGEN: 14 mg/dL (ref 8–27)
BUN / CREAT RATIO: 18 (ref 12–28)
CALCIUM: 9.9 mg/dL (ref 8.7–10.3)
CHLORIDE: 105 mmol/L (ref 96–106)
CO2: 22 mmol/L (ref 20–29)
CREATININE: 0.77 mg/dL (ref 0.57–1.00)
EGFR: 82 mL/min/{1.73_m2}
GLOBULIN, TOTAL: 2.4 g/dL (ref 1.5–4.5)
GLUCOSE: 77 mg/dL (ref 70–99)
POTASSIUM: 4.6 mmol/L (ref 3.5–5.2)
SODIUM: 139 mmol/L (ref 134–144)
TOTAL PROTEIN: 6.7 g/dL (ref 6.0–8.5)

## 2021-04-16 LAB — VITAMIN D 25 HYDROXY: VITAMIN D 25-HYDROXY: 30.5 ng/mL (ref 30.0–100.0)

## 2021-04-16 LAB — C-REACTIVE PROTEIN: C-REACTIVE PROTEIN: <1 mg/L (ref 0–10)

## 2021-04-16 LAB — FERRITIN: FERRITIN: 135 ng/mL (ref 15–150)

## 2021-04-17 NOTE — Unmapped (Signed)
Plan for labs    Plan Cipro 500 mg bid for 10 days, then re-assessment

## 2021-05-20 DIAGNOSIS — K6389 Other specified diseases of intestine: Principal | ICD-10-CM

## 2021-05-20 MED ORDER — CIPROFLOXACIN 500 MG TABLET
ORAL_TABLET | 0 refills | 0 days | Status: CP
Start: 2021-05-20 — End: ?

## 2021-05-20 NOTE — Unmapped (Signed)
Reason For Call:  Checking in post Cipro course, received refill request from the pharmacy.     Assessment:   Got better while on cipro, finished course and then son passed away last week and has been stressed. Sx proceeded to get worse, with increased bowel movements. Pt states she believes another course of Cipro would be helpful to get through this difficult time.    Instructions Provided:  Instructed pt that refill of Cipro would be sent to pharmacy for her to pick up.     Follow Up:  - Patient verbalized understanding and agreement with plan  - Instructed to reach out for any problems that occur       Workup Time:   5 mins

## 2021-05-20 NOTE — Unmapped (Signed)
Reason For Call:  Checking in post Cipro course, received refill request from the pharmacy.     Assessment:   Got better while on cipro, finished course and then son passed away last week and has been stressed. Sx proceeded to get worse, with increased bowel movements. Pt states she believes another course of Cipro would be helpful to get through this difficult time.    Instructions Provided:  Instructed pt that refill of Cipro would be sent to pharmacy for her to pick up.     Follow Up:  - Patient verbalized understanding and agreement with plan  - Instructed to reach out for any problems that occur       Workup Time:   5 mins

## 2021-08-05 DIAGNOSIS — K50019 Crohn's disease of small intestine with unspecified complications: Principal | ICD-10-CM

## 2021-08-17 ENCOUNTER — Other Ambulatory Visit: Payer: Self-pay | Admitting: Family Medicine

## 2021-08-17 DIAGNOSIS — Z1231 Encounter for screening mammogram for malignant neoplasm of breast: Secondary | ICD-10-CM

## 2021-09-14 ENCOUNTER — Ambulatory Visit
Admission: RE | Admit: 2021-09-14 | Discharge: 2021-09-14 | Disposition: A | Discharge status: Home / Self Care | Payer: Medicare Other | Source: Ambulatory Visit | Attending: Family Medicine | Admitting: Family Medicine

## 2021-09-14 DIAGNOSIS — Z1231 Encounter for screening mammogram for malignant neoplasm of breast: Secondary | ICD-10-CM | POA: Insufficient documentation

## 2021-11-02 IMAGING — MG DIGITAL SCREENING BILAT W/ TOMO W/ CAD
6 of 10 series · 6 of 30 positions shown · non-contrast
Comparison: Previous exam(s).

CLINICAL DATA: Screening.

EXAM:
DIGITAL SCREENING BILATERAL MAMMOGRAM WITH TOMO AND CAD

[R MLO synth-2D (1 of 2)]
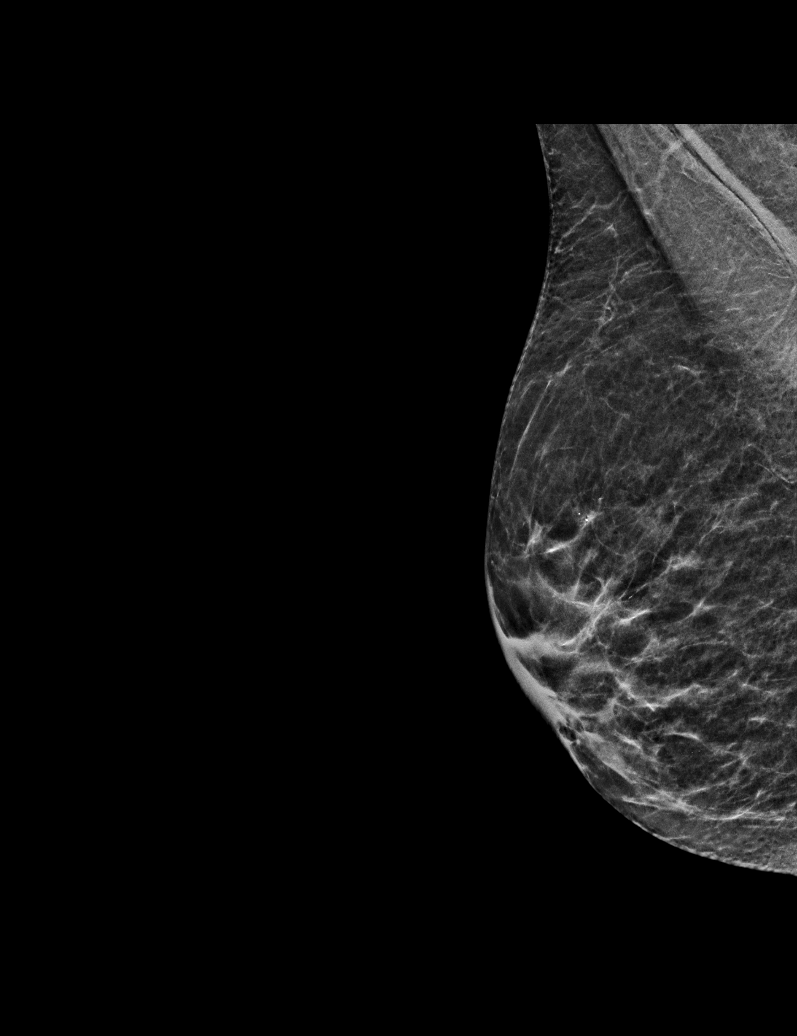

[R MLO synth-2D (2 of 2)]
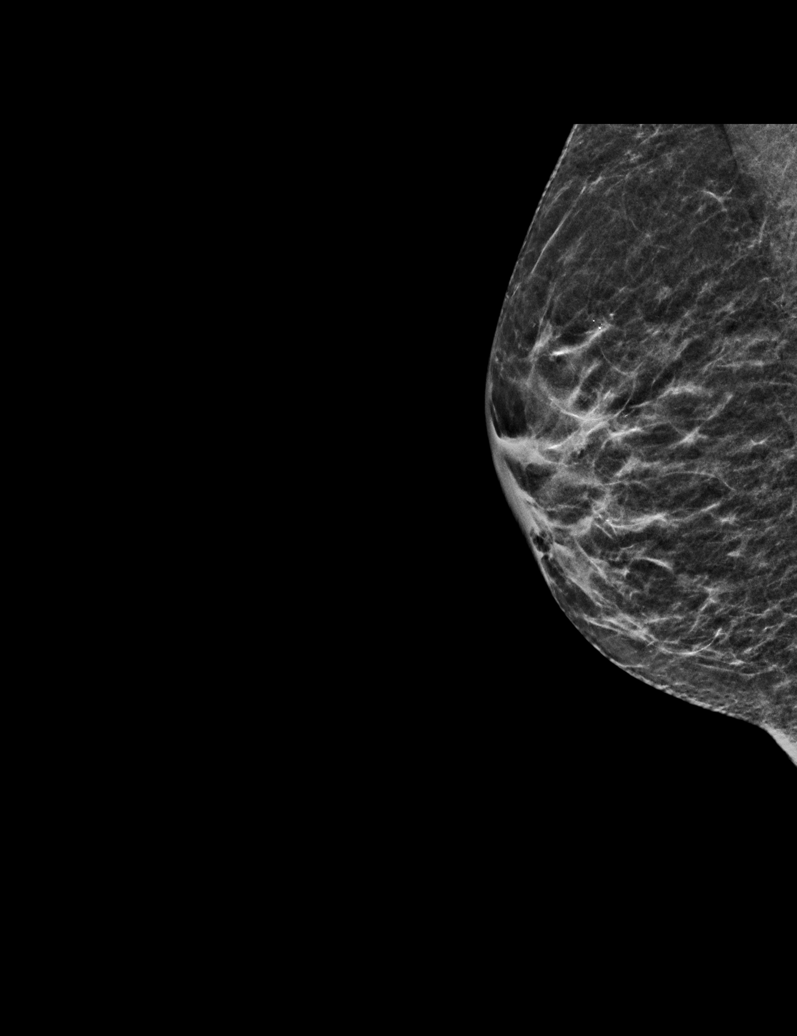

[R CC synth-2D]
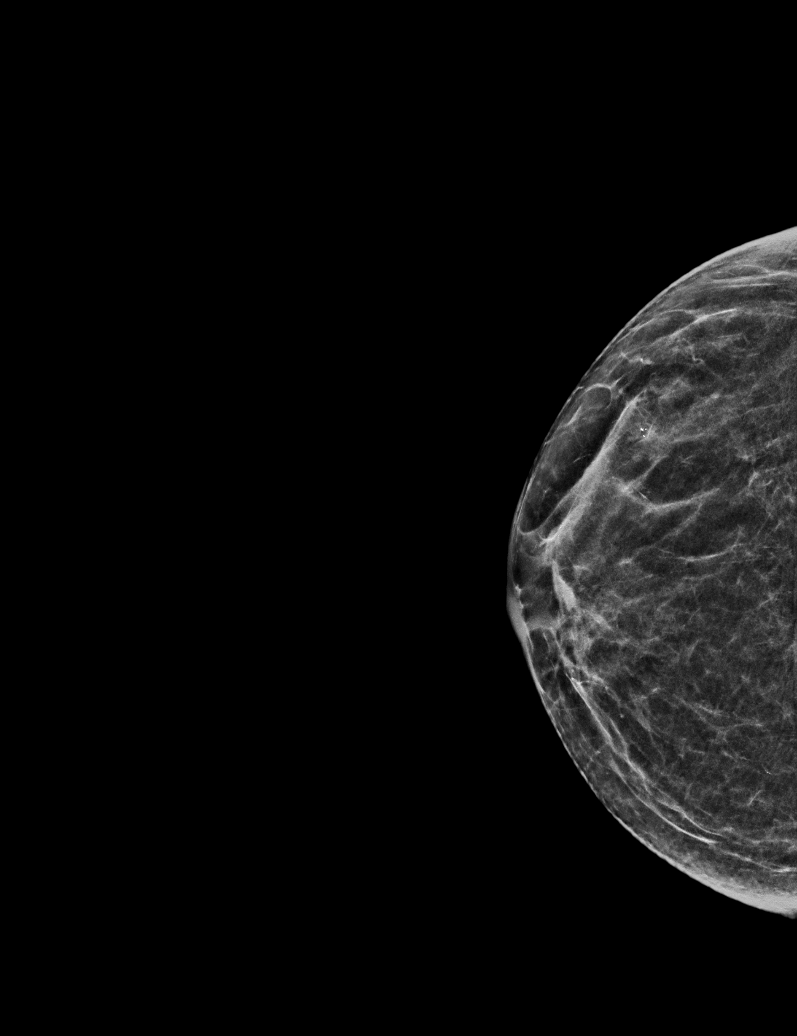

[L MLO synth-2D]
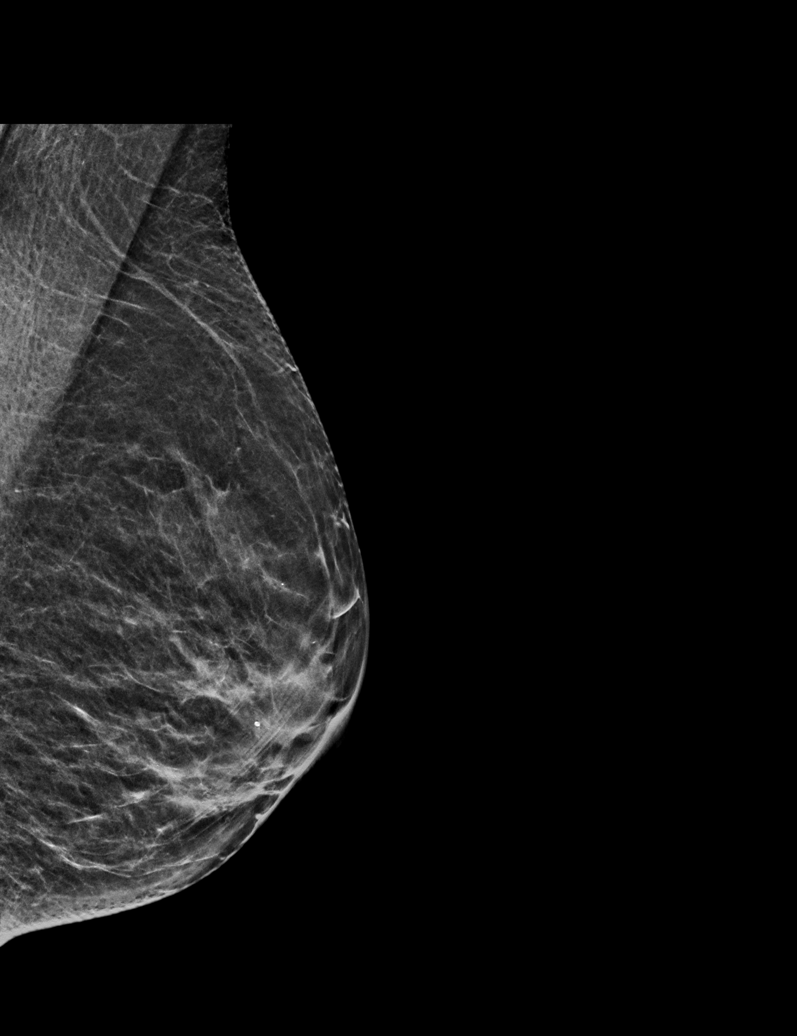

[L CC synth-2D]
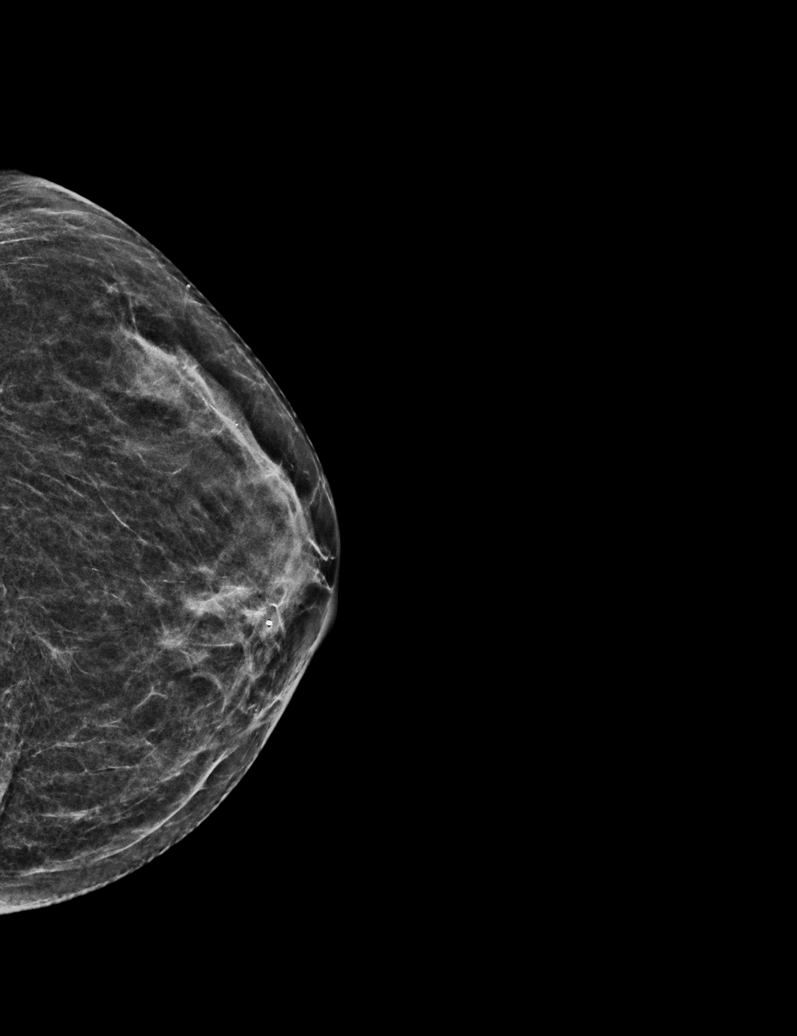

[L MLO tomo · tomo slice 23/44.0]
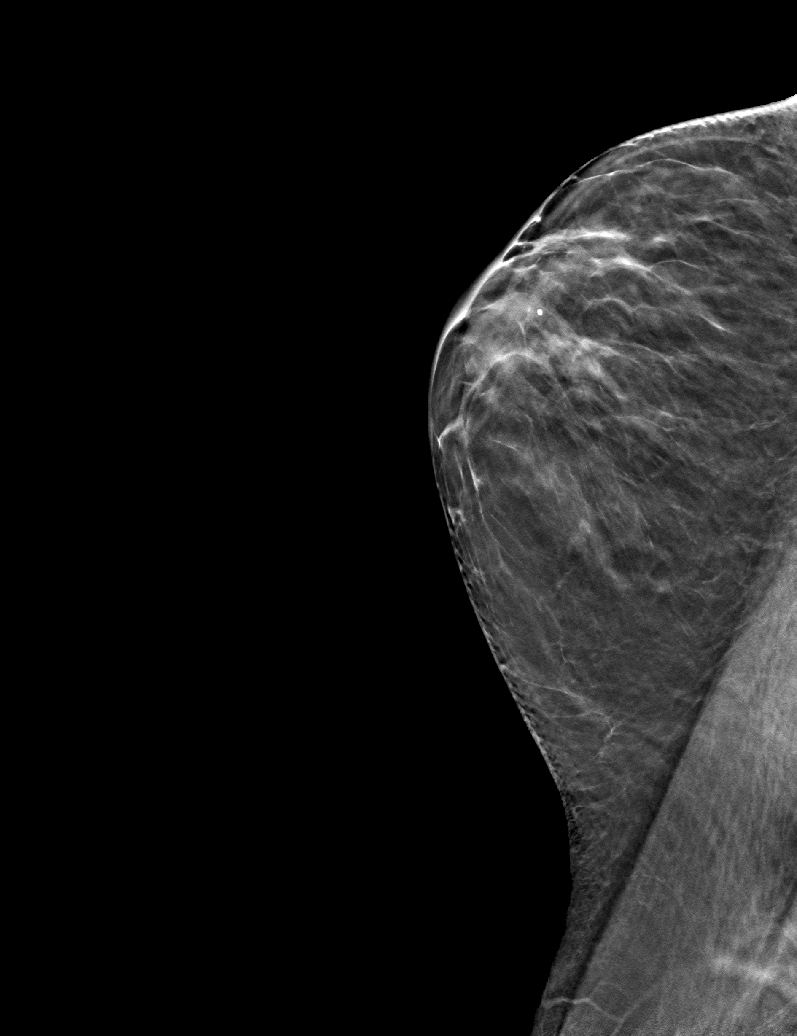

[6 of 30 positions shown; findings below may reference images not displayed]

ACR Breast Density Category b: There are scattered areas of
fibroglandular density.
FINDINGS: There are no findings suspicious for malignancy. Images were
processed with CAD.
IMPRESSION: No mammographic evidence of malignancy. A result letter of this
screening mammogram will be mailed directly to the patient.

RECOMMENDATION:
Screening mammogram in one year. (Code:[id number])

BI-RADS CATEGORY  1: Negative.

## 2022-01-17 DIAGNOSIS — K50019 Crohn's disease of small intestine with unspecified complications: Principal | ICD-10-CM

## 2022-01-17 NOTE — Unmapped (Signed)
Stelara refills authorized, Follow up appt needed with Dr. Gwenith Spitz, patient will be contacted by [facility] GI central scheduling to schedule

## 2022-03-15 NOTE — Unmapped (Signed)
Gastroenterology Return Visit Note      HISTORY OF PRESENT ILLNESS: This is a 72 y.o. year old female with a history of Crohn's disease as outlined below.     From CD aspect stable,weight stable around 112, bowel frequency inccreased to 4-6. Not able to afford Colestid now due to Copay of > $300 , before 1/2 tablet  Colestid / day. Higher dosing resulted  in more bloating. Takes one tablet imodium now before meals and thinks this helps as well. Lost son last year due to cardiac problems after COVID.     PAST MEDICAL HISTORY:    Past Medical History:   Diagnosis Date    Crohn's disease (CMS-HCC)     Disease of thyroid gland     Hypertension        PAST SURGICAL HISTORY:    Past Surgical History:   Procedure Laterality Date    ABDOMINAL SURGERY      colon resection    BUNIONECTOMY Left     with pins    COLON SURGERY      resection    FOOT SURGERY Left     bunionectomy; pins placed    left foot surgery      PR COLONOSCOPY FLX DX W/COLLJ SPEC WHEN PFRMD N/A 08/03/2012    Procedure: COLONOSCOPY, FLEXIBLE, PROXIMAL TO SPLENIC FLEXURE; DIAGNOSTIC, W/WO COLLECTION SPECIMEN BY BRUSH OR WASH;  Surgeon: Trula Slade, MD;  Location: [facility];  Service: Gastroenterology    PR COLONOSCOPY FLX DX W/COLLJ SPEC WHEN PFRMD N/A 10/24/2014    Procedure: COLONOSCOPY, FLEXIBLE, PROXIMAL TO SPLENIC FLEXURE; DIAGNOSTIC, W/WO COLLECTION SPECIMEN BY BRUSH OR WASH;  Surgeon: Trula Slade, MD;  Location: [facility];  Service: Gastroenterology    PR COLONOSCOPY FLX DX W/COLLJ SPEC WHEN PFRMD N/A 04/08/2016    Procedure: COLONOSCOPY, FLEXIBLE, PROXIMAL TO SPLENIC FLEXURE; DIAGNOSTIC, W/WO COLLECTION SPECIMEN BY BRUSH OR WASH;  Surgeon: Modena Nunnery, MD;  Location: [facility];  Service: Gastroenterology    PR COLONOSCOPY FLX DX W/COLLJ SPEC WHEN PFRMD N/A 12/27/2017    Procedure: COLONOSCOPY, FLEXIBLE, PROXIMAL TO SPLENIC FLEXURE; DIAGNOSTIC, W/WO COLLECTION SPECIMEN BY BRUSH OR WASH;  Surgeon: Rona Ravens, MD;  Location: [facility];  Service: Gastroenterology    PR COLONOSCOPY FLX DX W/COLLJ SPEC WHEN PFRMD N/A 05/25/2018    Procedure: COLONOSCOPY, FLEXIBLE, PROXIMAL TO SPLENIC FLEXURE; DIAGNOSTIC, W/WO COLLECTION SPECIMEN BY BRUSH OR WASH;  Surgeon: Modena Nunnery, MD;  Location: [facility];  Service: Gastroenterology    PR COLONOSCOPY FLX DX W/COLLJ SPEC WHEN PFRMD N/A 10/09/2018    Procedure: COLONOSCOPY, FLEXIBLE, PROXIMAL TO SPLENIC FLEXURE; DIAGNOSTIC, W/WO COLLECTION SPECIMEN BY BRUSH OR WASH;  Surgeon: Rona Ravens, MD;  Location: [facility];  Service: Gastroenterology    PR COLONOSCOPY W/BIOPSY SINGLE/MULTIPLE N/A 01/26/2018    Procedure: COLONOSCOPY, FLEXIBLE, PROXIMAL TO SPLENIC FLEXURE; WITH BIOPSY, SINGLE OR MULTIPLE;  Surgeon: Modena Nunnery, MD;  Location: [facility];  Service: Gastroenterology         CURRENT MEDICATIONS:      Current Outpatient Medications:     calcium-vitamin D (CALCIUM-VITAMIN D) 500 mg(1,250mg ) -200 unit per tablet, Take 1 tablet by mouth daily. , Disp: , Rfl:     ciprofloxacin HCl (CIPRO) 500 MG tablet, TAKE 1 TABLET(500 MG) BY MOUTH TWICE DAILY, Disp: 20 tablet, Rfl: 0    colestipoL (COLESTID) 1 gram tablet, Take 2 tablets (2  g total) by mouth Two (2) times a day. Take 1-2 tablets approx. 10 minutes  before large meals. Take other medications 2-3 hours apart., Disp: 360 tablet, Rfl: 0    cyanocobalamin 2000 MCG tablet, Take 2,000 mcg by mouth daily., Disp: , Rfl:     diphenoxylate-atropine (LOMOTIL) 2.5-0.025 mg per tablet, Take 1 tablet by mouth 4 (four) times a day as needed for diarrhea for up to 30 doses., Disp: 30 tablet, Rfl: 0    levothyroxine (SYNTHROID) 25 MCG tablet, Take 25 mcg by mouth daily at 0600. , Disp: , Rfl:     metoprolol succinate (TOPROL-XL) 50 MG 24 hr tablet, Take 50 mg by mouth daily., Disp: , Rfl:     multivitamin capsule, Take 1 capsule by mouth daily., Disp: , Rfl:     ustekinumab (STELARA) 45 mg/0.5 mL Soln, 90mg  subcutaneous Every 8 weeks, filled with [facility] [id number], Disp: 1 mL, Rfl: 5       VITAL SIGNS:  BP Readings from Last 4 Encounters:   08/01/19 139/69   05/16/19 139/64   11/27/18 160/67   10/09/18 109/59      Wt Readings from Last 4 Encounters:   08/01/19 50.3 kg (111 lb)   05/16/19 49.4 kg (109 lb)   11/27/18 51.1 kg (112 lb 9.6 oz)   10/09/18 50.8 kg (112 lb)        BMI: Estimated body mass index is 18.47 kg/m?? as calculated from the following:    Height as of 10/09/18: 165.1 cm (5' 5).    Weight as of 08/01/19: 50.3 kg (111 lb).    BSA: Estimated body surface area is 1.52 meters squared as calculated from the following:    Height as of 10/09/18: 165.1 cm (5' 5).    Weight as of 08/01/19: 50.3 kg (111 lb).      PHYSICAL EXAM:  Televisit      ASSESSMENT:    1. Crohns disease with Hx of small bowel resection due to stricturing and fistulizing luminal disease in 2008 and recurrence at anastomosis in 02/2010. Start of Humira in 04/2010       Course of Disease:  First diagnosis of Crohn's disease in approximately the late 1980s. At that time, she had cramping abdominal pain and diarrhea. She was started on a steroid taper and maintained on sulfasalazine. Remission until approximately 2004. In 2004 start of abdominal bloating, epigastric pain, and intermittent loose stools. 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 Start of ciprofloxacin therapy approximately in 2005 as well as Entocort and of Pentasa 4 g daily. Approximately in the beginning of 2007 decrease of entocort to 6 mg per day, however, still abdominal pain, bloating. 05/08/06 Ileocecal resection with ileostomy and repair of ileal fistula as well as takedown and repair of ileal sigmoid fistula with sigmoid colectomy (About 18 inches of small bowel and 4 inches of cecum/colon were resected). 06/22/06,ileostomy takedown with ileal colonic anastomosis. 11/2006 remission. Intolerance of 6-MP therapy (joint pain, hair loss, nausea).12/08Start of sulfasalazine due to joint pain. 5/09-10/2009 remission. 04/2010, start of Humira. 09/2010 -02/2012 clinical remission. 03/2014 mild symptoms in the settings of missing a dose of Humira 07/2014 remission but another interruption of Humira therapy for 4 weeks due to foot surgery 01/2015 increase to weekly Humira therapy in the setting of I3 recurrence 01/2017 continues weight loss on weekly Humira therapy 02/2017 CT showing several inflammatory small bowel segments 03/2017 start of prednisone taper , improvement  09/2017 after end of steroid taper recurrence of diarrhea despite weekly Humira, weight loss and mild abdominal pain.  10/2017- 09/2018 participating in etrolizumab trial without significant success 10/2018 start ustekinumab 05/2019 mild-moderate disease activity. SIBO, improvement after Cipro1/2022 stable, mild weight gain 03/2022 realtively stable      Montreal Classification: [id number]     Crohn's disease: On 8-weekly Stelara .  The patient has currently around 4-6 bowel movements. Need for colonoscopy, but problems with transportation. Her sister just got hip replacement in September and is not yet fit, will re-address in 8 weeks and see if we can do a colonoscopy in March. 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   Abdominal pain: Not present    Anda Kraft Index for Crohn's Disease  General Well Being: 0 = Very well  Abdominal Pain:  0 = none  Number of Liquid Stools Per day: 4  Abdominal Mass:  0 = None  Number of Extraintestinal Manifestations:  0   1 point each for: 1. Arthritis/arthralgias, 2. Iritis/uveitis, 3. Active perianal dz, 4. Other active fistula, 5. Erythema nodosum or pyoderma, 6. Other    Total HBI Score (0-25):  4     Score:  Remission < 5;  Mild dz 5-7;  Moderate dz 8-16;  Severe > 16      Back pain: MRI performed outside shows some mild disc bulging in the region of L4-L5 and S1.  Currently no complaint    Labs: Need for recheck and ordered with [facility]    B12 deficiency: Patient on B12 sublingual, recheck with next lab       Bone health: Vitamin D normal (54; 05/2019)  Recheck with next labs    Health maintenance: Prevnar 2015 and 2016. Pneumovax 2016.  Shingrix completed.  Recommendation for Pneumovax locally in the past, but not completed    Portions of this record have been created using Scientist, clinical (histocompatibility and immunogenetics). Dictation errors have been sought, but may not have been identified and corrected        The patient reports they are currently: at home. I spent 15 minutes on the phone visit with the patient on the date of service. I spent an additional 15 minutes on pre- and post-visit activities on the date of service.     The patient was not located and I was located within 250 yards of a hospital based location during the phone visit. The patient was physically located in [place] or a state in which I am permitted to provide care. The patient and/or parent/guardian understood that s/he may incur co-pays and cost sharing, and agreed to the telemedicine visit. The visit was reasonable and appropriate under the circumstances given the patient's presentation at the time.    The patient and/or parent/guardian has been advised of the potential risks and limitations of this mode of treatment (including, but not limited to, the absence of in-person examination) and has agreed to be treated using telemedicine. The patient's/patient's family's questions regarding telemedicine have been answered.    If the visit was completed in an ambulatory setting, the patient and/or parent/guardian has also been advised to contact their provider???s office for worsening conditions, and seek emergency medical treatment and/or call 911 if the patient deems either necessary.            PLAN:  Patient Instructions   Plan for colonoscopy in spring, I will call you when I have the labs and then we check on transportation  Continue Stelara q 8 weeks      DIAGNOSTIC STUDIES:  I have reviewed all pertinent diagnostic studies, including:    Colonoscopy 10/2018  Patent end-to-side ileo-colonic anastomosis,                        characterized by healthy appearing mucosa.                       - Multiple ulcers in the terminal ileum.                       - Simple Endoscopic Score for Crohn's Disease: 8,                        mucosal inflammatory changes secondary to Crohn's                        disease with ileitis.                       - The rectum, splenic flexure and transverse colon are                        normal.                       - Diverticulosis in the sigmoid colon and in the                        descending colon.        Laboratory results    All Labs  No visits with results within 1 Week(s) from this visit.   Latest known visit with results is:   Telemedicine on 04/15/2021   Component Date Value Ref Range Status    WBC 04/15/2021 8.7  3.4 - 10.8 x10E3/uL Final    RBC 04/15/2021 3.88  3.77 - 5.28 x10E6/uL Final    HGB 04/15/2021 12.4  11.1 - 15.9 g/dL Final    HCT 65/78/4696 36.1  34.0 - 46.6 % Final    MCV 04/15/2021 93  79 - 97 fL Final    MCH 04/15/2021 32.0  26.6 - 33.0 pg Final    MCHC 04/15/2021 34.3  31.5 - 35.7 g/dL Final    RDW 29/52/8413 12.8  11.7 - 15.4 % Final    Platelet 04/15/2021 243  150 - 450 x10E3/uL Final    Neutrophils % 04/15/2021 63  Not Estab. % Final    Lymphocytes % 04/15/2021 26  Not Estab. % Final    Monocytes % 04/15/2021 8  Not Estab. % Final    Eosinophils % 04/15/2021 2  Not Estab. % Final    Basophils % 04/15/2021 1  Not Estab. % Final    Absolute Neutrophils 04/15/2021 5.5  1.4 - 7.0 x10E3/uL Final    Absolute Lymphocytes 04/15/2021 2.2  0.7 - 3.1 x10E3/uL Final    Absolute Monocytes  04/15/2021 0.7  0.1 - 0.9 x10E3/uL Final    Absolute Eosinophils 04/15/2021 0.1  0.0 - 0.4 x10E3/uL Final    Absolute Basophils 04/15/2021 0.1  0.0 - 0.2 x10E3/uL Final    Immature Granulocytes 04/15/2021 0  Not Estab. % Final    Bands Absolute 04/15/2021 0.0  0.0 - 0.1 x10E3/uL Final    Glucose 04/15/2021 77  70 - 99 mg/dL  Final    BUN 04/15/2021 14  8 - 27 mg/dL Final    Creatinine 09/81/1914 0.77  0.57 - 1.00 mg/dL Final    eGFR 78/29/5621 82  >59 mL/min/1.73 Final    BUN/Creatinine Ratio 04/15/2021 18  12 - 28 Final    Sodium 04/15/2021 139  134 - 144 mmol/L Final    Potassium 04/15/2021 4.6  3.5 - 5.2 mmol/L Final    Chloride 04/15/2021 105  96 - 106 mmol/L Final    CO2 04/15/2021 22  20 - 29 mmol/L Final    Calcium 04/15/2021 9.9  8.7 - 10.3 mg/dL Final    Total Protein 04/15/2021 6.7  6.0 - 8.5 g/dL Final    Albumin 30/86/5784 4.3  3.7 - 4.7 g/dL Final    Globulin, Total 04/15/2021 2.4  1.5 - 4.5 g/dL Final    A/G Ratio 69/62/9528 1.8  1.2 - 2.2 Final    Total Bilirubin 04/15/2021 0.3  0.0 - 1.2 mg/dL Final    Alkaline Phosphatase 04/15/2021 111  44 - 121 IU/L Final    AST 04/15/2021 20  0 - 40 IU/L Final    ALT 04/15/2021 15  0 - 32 IU/L Final    CRP 04/15/2021 <1  0 - 10 mg/L Final    Ferritin 04/15/2021 135  15 - 150 ng/mL Final    Vit D, 25-Hydroxy 04/15/2021 30.5  30.0 - 100.0 ng/mL Final

## 2022-03-16 ENCOUNTER — Telehealth: Admit: 2022-03-16 | Discharge: 2022-03-17 | Payer: MEDICARE | Attending: Gastroenterology | Primary: Gastroenterology

## 2022-03-17 LAB — COMPREHENSIVE METABOLIC PANEL
A/G RATIO: 1.8 (ref 1.2–2.2)
ALBUMIN: 4.4 g/dL (ref 3.8–4.8)
ALKALINE PHOSPHATASE: 98 IU/L (ref 44–121)
ALT (SGPT): 16 IU/L (ref 0–32)
AST (SGOT): 23 IU/L (ref 0–40)
BILIRUBIN TOTAL (MG/DL) IN SER/PLAS: 0.3 mg/dL (ref 0.0–1.2)
BLOOD UREA NITROGEN: 10 mg/dL (ref 8–27)
BUN / CREAT RATIO: 14 (ref 12–28)
CALCIUM: 9.7 mg/dL (ref 8.7–10.3)
CHLORIDE: 105 mmol/L (ref 96–106)
CO2: 24 mmol/L (ref 20–29)
CREATININE: 0.69 mg/dL (ref 0.57–1.00)
EGFR: 93 mL/min/{1.73_m2}
GLOBULIN, TOTAL: 2.4 g/dL (ref 1.5–4.5)
GLUCOSE: 93 mg/dL (ref 70–99)
POTASSIUM: 4.1 mmol/L (ref 3.5–5.2)
SODIUM: 142 mmol/L (ref 134–144)
TOTAL PROTEIN: 6.8 g/dL (ref 6.0–8.5)

## 2022-03-17 LAB — CBC W/ DIFFERENTIAL
BANDED NEUTROPHILS ABSOLUTE COUNT: 0 10*3/uL (ref 0.0–0.1)
BASOPHILS ABSOLUTE COUNT: 0 10*3/uL (ref 0.0–0.2)
BASOPHILS RELATIVE PERCENT: 1 %
EOSINOPHILS ABSOLUTE COUNT: 0.1 10*3/uL (ref 0.0–0.4)
EOSINOPHILS RELATIVE PERCENT: 1 %
HEMATOCRIT: 38.4 % (ref 34.0–46.6)
HEMOGLOBIN: 12.4 g/dL (ref 11.1–15.9)
IMMATURE GRANULOCYTES: 0 %
LYMPHOCYTES ABSOLUTE COUNT: 1.7 10*3/uL (ref 0.7–3.1)
LYMPHOCYTES RELATIVE PERCENT: 22 %
MEAN CORPUSCULAR HEMOGLOBIN CONC: 32.3 g/dL (ref 31.5–35.7)
MEAN CORPUSCULAR HEMOGLOBIN: 30.9 pg (ref 26.6–33.0)
MEAN CORPUSCULAR VOLUME: 96 fL (ref 79–97)
MONOCYTES ABSOLUTE COUNT: 0.6 10*3/uL (ref 0.1–0.9)
MONOCYTES RELATIVE PERCENT: 8 %
NEUTROPHILS ABSOLUTE COUNT: 5.3 10*3/uL (ref 1.4–7.0)
NEUTROPHILS RELATIVE PERCENT: 68 %
PLATELET COUNT: 228 10*3/uL (ref 150–450)
RED BLOOD CELL COUNT: 4.01 x10E6/uL (ref 3.77–5.28)
RED CELL DISTRIBUTION WIDTH: 12.4 % (ref 11.7–15.4)
WHITE BLOOD CELL COUNT: 7.8 10*3/uL (ref 3.4–10.8)

## 2022-03-17 LAB — IRON & TIBC
IRON SATURATION: 19 % (ref 15–55)
IRON: 68 ug/dL (ref 27–139)
TOTAL IRON BINDING CAPACITY: 351 ug/dL (ref 250–450)
UNSATURATED IRON BINDING CAPACITY: 283 ug/dL (ref 118–369)

## 2022-03-17 LAB — VITAMIN D 25 HYDROXY: VITAMIN D 25-HYDROXY: 30.7 ng/mL (ref 30.0–100.0)

## 2022-03-17 LAB — FERRITIN: FERRITIN: 136 ng/mL (ref 15–150)

## 2022-03-17 LAB — C-REACTIVE PROTEIN: C-REACTIVE PROTEIN: 1 mg/L (ref 0–10)

## 2022-03-17 NOTE — Unmapped (Signed)
Plan for colonoscopy in spring, I will call you when I have the labs and then we check on transportation     Continue Stelara q 8 weeks

## 2022-05-24 DIAGNOSIS — R051 Acute cough: Secondary | ICD-10-CM | POA: Diagnosis not present

## 2022-06-06 DIAGNOSIS — K5010 Crohn's disease of large intestine without complications: Secondary | ICD-10-CM | POA: Diagnosis not present

## 2022-06-06 DIAGNOSIS — J40 Bronchitis, not specified as acute or chronic: Secondary | ICD-10-CM | POA: Diagnosis not present

## 2022-06-13 DIAGNOSIS — E039 Hypothyroidism, unspecified: Secondary | ICD-10-CM | POA: Diagnosis not present

## 2022-06-13 DIAGNOSIS — I10 Essential (primary) hypertension: Secondary | ICD-10-CM | POA: Diagnosis not present

## 2022-06-20 DIAGNOSIS — Z720 Tobacco use: Secondary | ICD-10-CM | POA: Diagnosis not present

## 2022-06-20 DIAGNOSIS — I10 Essential (primary) hypertension: Secondary | ICD-10-CM | POA: Diagnosis not present

## 2022-06-20 DIAGNOSIS — Z87898 Personal history of other specified conditions: Secondary | ICD-10-CM | POA: Diagnosis not present

## 2022-06-20 DIAGNOSIS — E039 Hypothyroidism, unspecified: Secondary | ICD-10-CM | POA: Diagnosis not present

## 2022-08-09 ENCOUNTER — Other Ambulatory Visit: Payer: Self-pay | Admitting: Family Medicine

## 2022-08-09 DIAGNOSIS — Z1231 Encounter for screening mammogram for malignant neoplasm of breast: Secondary | ICD-10-CM

## 2022-09-27 ENCOUNTER — Ambulatory Visit
Admission: RE | Admit: 2022-09-27 | Discharge: 2022-09-27 | Disposition: A | Discharge status: Home / Self Care | Payer: No Typology Code available for payment source | Source: Ambulatory Visit | Attending: Family Medicine | Admitting: Family Medicine

## 2022-09-27 DIAGNOSIS — Z1231 Encounter for screening mammogram for malignant neoplasm of breast: Secondary | ICD-10-CM | POA: Insufficient documentation

## 2022-10-11 DIAGNOSIS — R058 Other specified cough: Secondary | ICD-10-CM | POA: Diagnosis not present

## 2022-10-11 DIAGNOSIS — J028 Acute pharyngitis due to other specified organisms: Secondary | ICD-10-CM | POA: Diagnosis not present

## 2022-10-11 DIAGNOSIS — R0981 Nasal congestion: Secondary | ICD-10-CM | POA: Diagnosis not present

## 2022-10-11 DIAGNOSIS — B9789 Other viral agents as the cause of diseases classified elsewhere: Secondary | ICD-10-CM | POA: Diagnosis not present

## 2022-11-01 DIAGNOSIS — M5414 Radiculopathy, thoracic region: Secondary | ICD-10-CM | POA: Diagnosis not present

## 2022-11-01 DIAGNOSIS — M546 Pain in thoracic spine: Secondary | ICD-10-CM | POA: Diagnosis not present

## 2022-11-01 DIAGNOSIS — M5441 Lumbago with sciatica, right side: Secondary | ICD-10-CM | POA: Diagnosis not present

## 2022-11-01 DIAGNOSIS — G8929 Other chronic pain: Secondary | ICD-10-CM | POA: Diagnosis not present

## 2022-12-01 DIAGNOSIS — K5000 Crohn's disease of small intestine without complications: Secondary | ICD-10-CM | POA: Diagnosis not present

## 2022-12-01 DIAGNOSIS — Z720 Tobacco use: Secondary | ICD-10-CM | POA: Diagnosis not present

## 2022-12-01 DIAGNOSIS — E739 Lactose intolerance, unspecified: Secondary | ICD-10-CM | POA: Diagnosis not present

## 2022-12-06 DIAGNOSIS — K5000 Crohn's disease of small intestine without complications: Secondary | ICD-10-CM | POA: Diagnosis not present

## 2023-01-03 ENCOUNTER — Encounter: Payer: Self-pay | Admitting: Internal Medicine

## 2023-01-10 ENCOUNTER — Encounter: Payer: Self-pay | Admitting: Internal Medicine

## 2023-01-10 ENCOUNTER — Ambulatory Visit
Admission: RE | Admit: 2023-01-10 | Discharge: 2023-01-10 | Disposition: A | Discharge status: Home / Self Care | Payer: No Typology Code available for payment source | Attending: Internal Medicine | Admitting: Internal Medicine

## 2023-01-10 ENCOUNTER — Encounter
Admission: RE | Disposition: A | Discharge status: Home / Self Care | Payer: Self-pay | Source: Home / Self Care | Attending: Internal Medicine

## 2023-01-10 ENCOUNTER — Ambulatory Visit: Payer: No Typology Code available for payment source | Admitting: Anesthesiology

## 2023-01-10 ENCOUNTER — Other Ambulatory Visit: Payer: Self-pay

## 2023-01-10 DIAGNOSIS — Z9049 Acquired absence of other specified parts of digestive tract: Secondary | ICD-10-CM | POA: Insufficient documentation

## 2023-01-10 DIAGNOSIS — K641 Second degree hemorrhoids: Secondary | ICD-10-CM | POA: Diagnosis not present

## 2023-01-10 DIAGNOSIS — I10 Essential (primary) hypertension: Secondary | ICD-10-CM | POA: Insufficient documentation

## 2023-01-10 DIAGNOSIS — K6389 Other specified diseases of intestine: Secondary | ICD-10-CM | POA: Insufficient documentation

## 2023-01-10 DIAGNOSIS — E039 Hypothyroidism, unspecified: Secondary | ICD-10-CM | POA: Insufficient documentation

## 2023-01-10 DIAGNOSIS — F17200 Nicotine dependence, unspecified, uncomplicated: Secondary | ICD-10-CM | POA: Insufficient documentation

## 2023-01-10 DIAGNOSIS — K529 Noninfective gastroenteritis and colitis, unspecified: Secondary | ICD-10-CM | POA: Diagnosis not present

## 2023-01-10 DIAGNOSIS — K5000 Crohn's disease of small intestine without complications: Secondary | ICD-10-CM | POA: Insufficient documentation

## 2023-01-10 DIAGNOSIS — Z980 Intestinal bypass and anastomosis status: Secondary | ICD-10-CM | POA: Insufficient documentation

## 2023-01-10 DIAGNOSIS — K649 Unspecified hemorrhoids: Secondary | ICD-10-CM | POA: Diagnosis not present

## 2023-01-10 HISTORY — DX: Hypothyroidism, unspecified: E03.9

## 2023-01-10 HISTORY — DX: Essential (primary) hypertension: I10

## 2023-01-10 HISTORY — DX: Crohn's disease, unspecified, without complications: K50.90

## 2023-01-10 HISTORY — DX: Lactose intolerance, unspecified: E73.9

## 2023-01-10 HISTORY — PX: BIOPSY: SHX5522

## 2023-01-10 HISTORY — PX: COLONOSCOPY WITH PROPOFOL: SHX5780

## 2023-01-10 HISTORY — DX: Personal history of other specified conditions: Z87.898

## 2023-01-10 HISTORY — DX: Unspecified asthma, uncomplicated: J45.909

## 2023-01-10 SURGERY — COLONOSCOPY WITH PROPOFOL
Anesthesia: General

## 2023-01-10 MED ORDER — SODIUM CHLORIDE 0.9 % IV SOLN: INTRAVENOUS | Status: DC

## 2023-01-10 MED ORDER — PROPOFOL 500 MG/50ML IV EMUL
INTRAVENOUS | Status: DC | PRN
  Administered 2023-01-10: 150 ug/kg/min via INTRAVENOUS

## 2023-01-10 MED ORDER — PROPOFOL 10 MG/ML IV BOLUS
INTRAVENOUS | Status: DC | PRN
  Administered 2023-01-10: 20 mg via INTRAVENOUS
  Administered 2023-01-10: 50 mg via INTRAVENOUS

## 2023-01-10 MED ORDER — LIDOCAINE HCL (CARDIAC) PF 100 MG/5ML IV SOSY
PREFILLED_SYRINGE | INTRAVENOUS | Status: DC | PRN
  Administered 2023-01-10: 50 mg via INTRAVENOUS

## 2023-01-10 NOTE — Anesthesia Preprocedure Evaluation (Addendum)
Anesthesia Evaluation  Patient identified by MRN, date of birth, ID band Patient awake    Reviewed: Allergy & Precautions, H&P , NPO status , Patient's Chart, lab work & pertinent test results  Airway Mallampati: II  TM Distance: >3 FB Neck ROM: full    Dental no notable dental hx.    Pulmonary Current Smoker and Patient abstained from smoking.   Pulmonary exam normal        Cardiovascular hypertension, Normal cardiovascular exam     Neuro/Psych negative neurological ROS  negative psych ROS   GI/Hepatic negative GI ROS, Neg liver ROS,,,  Endo/Other  Hypothyroidism    Renal/GU negative Renal ROS  negative genitourinary   Musculoskeletal   Abdominal Normal abdominal exam  (+)   Peds  Hematology negative hematology ROS (+)   Anesthesia Other Findings Past Medical History: No date: Asthma- pt denies No date: History of tachycardia No date: Hypertension No date: Hypothyroidism No date: Lactose intolerance  Past Surgical History: No date: BUNIONECTOMY; Left No date: COLON SURGERY     Comment:  small bowel resection No date: TRIGGER FINGER RELEASE     Reproductive/Obstetrics negative OB ROS                             Anesthesia Physical Anesthesia Plan  ASA: 2  Anesthesia Plan: General   Post-op Pain Management: Minimal or no pain anticipated   Induction: Intravenous  PONV Risk Score and Plan: Propofol infusion and TIVA  Airway Management Planned: Natural Airway  Additional Equipment:   Intra-op Plan:   Post-operative Plan:   Informed Consent: I have reviewed the patients History and Physical, chart, labs and discussed the procedure including the risks, benefits and alternatives for the proposed anesthesia with the patient or authorized representative who has indicated his/her understanding and acceptance.     Dental Advisory Given  Plan Discussed with: CRNA and  Surgeon  Anesthesia Plan Comments:         Anesthesia Quick Evaluation

## 2023-01-10 NOTE — H&P (Signed)
Outpatient short stay form Pre-procedure 01/10/2023 11:23 AM Emily Lutz, M.D.  Primary Physician: Emily Lutz, M.D.  Reason for visit:  Crohn's disease of the small intestine  History of present illness:   Ms. Emily Lutz is a pleasant 72 year old female who is transferring her gastroenterology care to our office due to a change in insurance. She had previously been seeing inflammatory bowel disease specialist Dr. Britt Lutz at [facility] for several years. Prior to that she had been managed by one of our currently retired physicians, Dr. Mechele Lutz. Patient had previously been treated with adalimumab with good response for several years but has been taking ustekinumab now for about the last 3-1/2 years with moderate improvement. She has 1 injection every 8 weeks and has a visiting nurse give her injection at her home on a routine basis. She has no symptoms of abdominal pain, nausea, vomiting, anorexia, hematochezia or melena. She denies any recent onset of arthralgias, visual changes, lower back pain, new rashes or ulcerations or nodules on the extremities. She does complain, however, been increased frequency of bowel movements from 1-3 loose stools per day to 4 to 6/day. She has continued to be under significant stress after losing her son to COVID infection in 2021 as well as her husband passing away. 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  Crohns disease with Hx of small bowel resection due to stricturing and fistulizing luminal disease in 2008 and recurrence at anastomosis in 02/2010. Start of Humira in 04/2010   Course of Disease: First diagnosis of Crohn's disease in approximately the late 1980s. At that time, she had cramping abdominal pain and diarrhea. She was started on a steroid taper and maintained on sulfasalazine. Remission until approximately 2004. In 2004 start of abdominal bloating, epigastric pain, and intermittent  loose stools. 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 Start of ciprofloxacin therapy approximately in 2005 as well as Entocort and of Pentasa 4 g daily. Approximately in the beginning of 2007 decrease of entocort to 6 mg per day, however, still abdominal pain, bloating. 05/08/06 Ileocecal resection with ileostomy and repair of ileal fistula as well as takedown and repair of ileal sigmoid fistula with sigmoid colectomy (About 18 inches of small bowel and 4 inches of cecum/colon were resected). 06/22/06,ileostomy takedown with ileal colonic anastomosis. 11/2006 remission. Intolerance of 6-MP therapy (joint pain, hair loss, nausea).12/08Start of sulfasalazine due to joint pain. 5/09-10/2009 remission. 04/2010, start of Humira. 09/2010 -02/2012 clinical remission. 03/2014 mild symptoms in the settings of missing a dose of Humira 07/2014 remission but another interruption of Humira therapy for 4 weeks due to foot surgery 01/2015 increase to weekly Humira therapy in the setting of I3 recurrence 01/2017 continues weight loss on weekly Humira therapy 02/2017 CT showing several inflammatory small bowel segments 03/2017 start of prednisone taper , improvement 09/2017 after end of steroid taper recurrence of diarrhea despite weekly Humira, weight loss and mild abdominal pain. 10/2017- 09/2018 participating in etrolizumab trial without significant success 10/2018 start ustekinumab 05/2019 mild-moderate disease activity. SIBO, improvement after Cipro1/2022 stable, mild weight gain 03/2022 realtively stable    Current Facility-Administered Medications:    0.9 %  sodium chloride infusion, , Intravenous, Continuous, Emily Lutz, Emily Nearing, MD, Last Rate: 20 mL/hr at 01/10/23 1104, New Bag at 01/10/23 1104  Medications Prior to Admission  Medication Sig Dispense Refill Last Dose   albuterol (ACCUNEB)  0.63 MG/3ML nebulizer solution Take 1 ampule by nebulization every 6 (six)  hours as needed for wheezing.      Cholecalciferol (VITAMIN D3) 250 MCG (10000 UT) capsule Take 10,000 Units by mouth daily.      levothyroxine (SYNTHROID) 75 MCG tablet Take 65 mcg by mouth daily before breakfast.   01/10/2023   metoprolol succinate (TOPROL-XL) 50 MG 24 hr tablet Take 50 mg by mouth daily. Take with or immediately following a meal.   01/10/2023   Multiple Vitamin (MULTIVITAMIN) tablet Take 1 tablet by mouth daily.      ustekinumab (STELARA) 45 MG/0.5ML injection Inject into the skin.   06/21/2022     Not on File   Past Medical History:  Diagnosis Date   Asthma    Crohn's disease (HCC)    History of tachycardia    Hypertension    Hypothyroidism    Lactose intolerance     Review of systems:  Otherwise negative.    Physical Exam  Gen: Alert, oriented. Appears stated age.  HEENT: [place]/AT. PERRLA. Lungs: CTA, no wheezes. CV: RR nl S1, S2. Abd: soft, benign, no masses. BS+ Ext: No edema. Pulses 2+    Planned procedures: Proceed with colonoscopy. The patient understands the nature of the planned procedure, indications, risks, alternatives and potential complications including but not limited to bleeding, infection, perforation, damage to internal organs and possible oversedation/side effects from anesthesia. The patient agrees and gives consent to proceed.  Please refer to procedure notes for findings, recommendations and patient disposition/instructions.     Emily Lutz K. Emily Lutz, M.D. Gastroenterology 01/10/2023  11:23 AM

## 2023-01-10 NOTE — Interval H&P Note (Signed)
History and Physical Interval Note:  01/10/2023 11:24 AM  Emily Lutz  has presented today for surgery, with the diagnosis of K50.00 (ICD-10-CM) - Crohn's disease of small intestine without complication.  The various methods of treatment have been discussed with the patient and family. After consideration of risks, benefits and other options for treatment, the patient has consented to  Procedure(s): COLONOSCOPY WITH PROPOFOL (N/A) as a surgical intervention.  The patient's history has been reviewed, patient examined, no change in status, stable for surgery.  I have reviewed the patient's chart and labs.  Questions were answered to the patient's satisfaction.     [place], [place]

## 2023-01-10 NOTE — Op Note (Signed)
[facility] Gastroenterology Patient Name: Emily Lutz Procedure Date: 01/10/2023 11:29 AM MRN: [id number] Account #: [id number] Date of Birth: [birth year] Admit Type: Outpatient Age: 72 Room: [facility] ENDO ROOM 4 Gender: Female Note Status: Finalized Instrument Name: Prentice Docker [id number] Procedure:             Colonoscopy Indications:           Disease activity assessment of Crohn's disease of the                         small bowel Providers:             Boykin Nearing. Aria Jarrard MD, MD Medicines:             Propofol per Anesthesia Complications:         No immediate complications. Estimated blood loss:                         Minimal. Procedure:             Pre-Anesthesia Assessment:                        - The risks and benefits of the procedure and the                         sedation options and risks were discussed with the                         patient. All questions were answered and informed                         consent was obtained.                        - Patient identification and proposed procedure were                         verified prior to the procedure by the nurse. The                         procedure was verified in the procedure room.                        - ASA Grade Assessment: III - A patient with severe                         systemic disease.                        - After reviewing the risks and benefits, the patient                         was deemed in satisfactory condition to undergo the                         procedure.                        After obtaining informed consent, the colonoscope was  passed under direct vision. Throughout the procedure,                         the patient's blood pressure, pulse, and oxygen                         saturations were monitored continuously. The                         Colonoscope was introduced through the anus and                         advanced to the the  ileocolonic anastomosis. The                         colonoscopy was somewhat difficult due to restricted                         mobility of the colon. Successful completion of the                         procedure was aided by straightening and shortening                         the scope to obtain bowel loop reduction. The patient                         tolerated the procedure well. The quality of the bowel                         preparation was adequate. Ileocolonic anastomosis and                         neoterminal ileum were photographed. Findings:      The perianal and digital rectal examinations were normal. Pertinent       negatives include normal sphincter tone and no palpable rectal lesions.      Non-bleeding internal hemorrhoids were found during retroflexion. The       hemorrhoids were Grade II (internal hemorrhoids that prolapse but reduce       spontaneously).      There was evidence of a prior end-to-end ileo-colonic anastomosis in the       ascending colon. This was patent and was characterized by congestion,       erythema, friable mucosa and ulceration. The anastomosis was traversed.      A patchy area of mucosa in the ileum, 4 cm from the ileocolonic       anastomosis was mildly friable with no bleeding. Biopsies were taken       with a cold forceps for histology. Estimated blood loss was minimal.      some ulcerations with pus also noted in the distal 4cm of the       neo-terminal ileum.      Normal mucosa was found in the entire colon. Biopsies for histology were       taken with a cold forceps from the right colon, left colon and rectum       for evaluation of microscopic colitis.      The exam was otherwise without abnormality. Impression:            -  Non-bleeding internal hemorrhoids.                        - Patent end-to-end ileo-colonic anastomosis,                         characterized by congestion, erythema, friable mucosa                         and  ulceration.                        - Friability with no bleeding in the ileum, 4 cm from                         the ileocolonic anastomosis. Biopsied.                        - Normal mucosa in the entire examined colon. Biopsied.                        - The examination was otherwise normal. Recommendation:        - Patient has a contact number available for                         emergencies. The signs and symptoms of potential                         delayed complications were discussed with the patient.                         Return to normal activities tomorrow. Written                         discharge instructions were provided to the patient.                        - Resume previous diet.                        - Continue present medications.                        - Await pathology results.                        - Repeat colonoscopy is recommended to check healing.                         The colonoscopy date will be determined after                         pathology results from today's exam become available                         for review.                        - Return to my office in 2 months.                        -  The findings and recommendations were discussed with                         the patient. Procedure Code(s):     --- Professional ---                        [phone], Colonoscopy, flexible; with biopsy, single or                         multiple Diagnosis Code(s):     --- Professional ---                        K50.00, Crohn's disease of small intestine without                         complications                        K64.1, Second degree hemorrhoids                        K63.89, Other specified diseases of intestine                        Z98.0, Intestinal bypass and anastomosis status CPT copyright 2022 American Medical Association. All rights reserved. The codes documented in this report are preliminary and upon coder review may  be revised to meet  current compliance requirements. Stanton Kidney MD, MD 01/10/2023 11:59:06 AM This report has been signed electronically. Number of Addenda: 0 Note Initiated On: 01/10/2023 11:29 AM Scope Withdrawal Time: 0 hours 6 minutes 48 seconds  Total Procedure Duration: 0 hours 12 minutes 45 seconds  Estimated Blood Loss:  Estimated blood loss was minimal.      [facility]

## 2023-01-10 NOTE — Anesthesia Procedure Notes (Signed)
Procedure Name: MAC Date/Time: 01/10/2023 11:32 AM  Performed by: Ginger Carne, CRNAPre-anesthesia Checklist: Patient identified, Emergency Drugs available, Suction available, Patient being monitored and Timeout performed Patient Re-evaluated:Patient Re-evaluated prior to induction Oxygen Delivery Method: Nasal cannula Preoxygenation: Pre-oxygenation with 100% oxygen Induction Type: IV induction

## 2023-01-10 NOTE — Transfer of Care (Signed)
Immediate Anesthesia Transfer of Care Note  Patient: Emily Lutz  Procedure(s) Performed: COLONOSCOPY WITH PROPOFOL BIOPSY  Patient Location: Endoscopy Unit  Anesthesia Type:General  Level of Consciousness: awake  Airway & Oxygen Therapy: Patient Spontanous Breathing  Post-op Assessment: Report given to RN and Post -op Vital signs reviewed and stable  Post vital signs: Reviewed and stable  Last Vitals:  Vitals Value Taken Time  BP 112/55 01/10/23 1155  Temp    Pulse 77 01/10/23 1156  Resp 16 01/10/23 1156  SpO2 100 % 01/10/23 1156  Vitals shown include unfiled device data.  Last Pain:  Vitals:   01/10/23 1154  TempSrc: Temporal  PainSc: 0-No pain         Complications: No notable events documented.

## 2023-01-10 NOTE — Anesthesia Postprocedure Evaluation (Signed)
Anesthesia Post Note  Patient: Emily Lutz  Procedure(s) Performed: COLONOSCOPY WITH PROPOFOL BIOPSY  Patient location during evaluation: Endoscopy Anesthesia Type: General Level of consciousness: awake and alert Pain management: pain level controlled Vital Signs Assessment: post-procedure vital signs reviewed and stable Respiratory status: spontaneous breathing, nonlabored ventilation and respiratory function stable Cardiovascular status: blood pressure returned to baseline and stable Postop Assessment: no apparent nausea or vomiting Anesthetic complications: no   No notable events documented.   Last Vitals:  Vitals:   01/10/23 1207 01/10/23 1215  BP: 136/69 (!) 151/82  Pulse: 65 72  Resp: 18 18  Temp:    SpO2: 100% 100%    Last Pain:  Vitals:   01/10/23 1207  TempSrc:   PainSc: 0-No pain                 Foye Deer

## 2023-01-11 ENCOUNTER — Encounter: Payer: Self-pay | Admitting: Internal Medicine

## 2023-01-11 LAB — SURGICAL PATHOLOGY

## 2023-01-16 DIAGNOSIS — I10 Essential (primary) hypertension: Secondary | ICD-10-CM | POA: Diagnosis not present

## 2023-01-16 DIAGNOSIS — Z720 Tobacco use: Secondary | ICD-10-CM | POA: Diagnosis not present

## 2023-01-16 DIAGNOSIS — Z87898 Personal history of other specified conditions: Secondary | ICD-10-CM | POA: Diagnosis not present

## 2023-01-16 DIAGNOSIS — E039 Hypothyroidism, unspecified: Secondary | ICD-10-CM | POA: Diagnosis not present

## 2023-01-17 DIAGNOSIS — Z0000 Encounter for general adult medical examination without abnormal findings: Secondary | ICD-10-CM | POA: Diagnosis not present

## 2023-01-17 DIAGNOSIS — I10 Essential (primary) hypertension: Secondary | ICD-10-CM | POA: Diagnosis not present

## 2023-01-17 DIAGNOSIS — E039 Hypothyroidism, unspecified: Secondary | ICD-10-CM | POA: Diagnosis not present

## 2023-01-17 DIAGNOSIS — Z720 Tobacco use: Secondary | ICD-10-CM | POA: Diagnosis not present

## 2023-02-20 DIAGNOSIS — K5000 Crohn's disease of small intestine without complications: Secondary | ICD-10-CM | POA: Diagnosis not present

## 2023-02-20 DIAGNOSIS — Z114 Encounter for screening for human immunodeficiency virus [HIV]: Secondary | ICD-10-CM | POA: Diagnosis not present

## 2023-02-28 NOTE — Unmapped (Signed)
Patient has out of network coverage and prefers to find provider closer to home that will accept her new plan.  She will update IBD center prn.

## 2023-06-12 DIAGNOSIS — R509 Fever, unspecified: Secondary | ICD-10-CM | POA: Diagnosis not present

## 2023-07-10 DIAGNOSIS — I10 Essential (primary) hypertension: Secondary | ICD-10-CM | POA: Diagnosis not present

## 2023-07-10 DIAGNOSIS — E039 Hypothyroidism, unspecified: Secondary | ICD-10-CM | POA: Diagnosis not present

## 2023-07-11 DIAGNOSIS — M5442 Lumbago with sciatica, left side: Secondary | ICD-10-CM | POA: Diagnosis not present

## 2023-07-11 DIAGNOSIS — M5416 Radiculopathy, lumbar region: Secondary | ICD-10-CM | POA: Diagnosis not present

## 2023-07-17 DIAGNOSIS — E039 Hypothyroidism, unspecified: Secondary | ICD-10-CM | POA: Diagnosis not present

## 2023-07-17 DIAGNOSIS — Z87898 Personal history of other specified conditions: Secondary | ICD-10-CM | POA: Diagnosis not present

## 2023-07-17 DIAGNOSIS — Z720 Tobacco use: Secondary | ICD-10-CM | POA: Diagnosis not present

## 2023-07-17 DIAGNOSIS — M5432 Sciatica, left side: Secondary | ICD-10-CM | POA: Diagnosis not present

## 2023-07-17 DIAGNOSIS — I10 Essential (primary) hypertension: Secondary | ICD-10-CM | POA: Diagnosis not present

## 2023-07-17 DIAGNOSIS — K5000 Crohn's disease of small intestine without complications: Secondary | ICD-10-CM | POA: Diagnosis not present

## 2023-07-18 ENCOUNTER — Other Ambulatory Visit: Payer: Self-pay | Admitting: Physical Medicine & Rehabilitation

## 2023-07-18 DIAGNOSIS — M25552 Pain in left hip: Secondary | ICD-10-CM | POA: Diagnosis not present

## 2023-07-18 DIAGNOSIS — M5416 Radiculopathy, lumbar region: Secondary | ICD-10-CM | POA: Diagnosis not present

## 2023-07-18 DIAGNOSIS — M5442 Lumbago with sciatica, left side: Secondary | ICD-10-CM

## 2023-07-22 ENCOUNTER — Ambulatory Visit
Admission: RE | Admit: 2023-07-22 | Discharge: 2023-07-22 | Disposition: A | Discharge status: Home / Self Care | Source: Ambulatory Visit | Attending: Physical Medicine & Rehabilitation | Admitting: Physical Medicine & Rehabilitation

## 2023-07-22 DIAGNOSIS — M47816 Spondylosis without myelopathy or radiculopathy, lumbar region: Secondary | ICD-10-CM | POA: Diagnosis not present

## 2023-07-22 DIAGNOSIS — M4316 Spondylolisthesis, lumbar region: Secondary | ICD-10-CM | POA: Diagnosis not present

## 2023-07-22 DIAGNOSIS — M5442 Lumbago with sciatica, left side: Secondary | ICD-10-CM

## 2023-08-15 DIAGNOSIS — M5442 Lumbago with sciatica, left side: Secondary | ICD-10-CM | POA: Diagnosis not present

## 2023-08-15 DIAGNOSIS — M8448XA Pathological fracture, other site, initial encounter for fracture: Secondary | ICD-10-CM | POA: Diagnosis not present

## 2023-08-15 DIAGNOSIS — M5416 Radiculopathy, lumbar region: Secondary | ICD-10-CM | POA: Diagnosis not present

## 2023-08-16 ENCOUNTER — Other Ambulatory Visit: Payer: Self-pay | Admitting: Physical Medicine & Rehabilitation

## 2023-08-16 DIAGNOSIS — M8448XA Pathological fracture, other site, initial encounter for fracture: Secondary | ICD-10-CM

## 2023-09-14 DIAGNOSIS — R152 Fecal urgency: Secondary | ICD-10-CM | POA: Diagnosis not present

## 2023-09-14 DIAGNOSIS — Z9889 Other specified postprocedural states: Secondary | ICD-10-CM | POA: Diagnosis not present

## 2023-09-14 DIAGNOSIS — R159 Full incontinence of feces: Secondary | ICD-10-CM | POA: Diagnosis not present

## 2023-09-14 DIAGNOSIS — K50819 Crohn's disease of both small and large intestine with unspecified complications: Secondary | ICD-10-CM | POA: Diagnosis not present

## 2023-09-14 DIAGNOSIS — Z9049 Acquired absence of other specified parts of digestive tract: Secondary | ICD-10-CM | POA: Diagnosis not present

## 2023-09-15 DIAGNOSIS — K50819 Crohn's disease of both small and large intestine with unspecified complications: Secondary | ICD-10-CM | POA: Diagnosis not present

## 2023-09-23 ENCOUNTER — Ambulatory Visit
Admission: RE | Admit: 2023-09-23 | Discharge: 2023-09-23 | Disposition: A | Discharge status: Home / Self Care | Source: Ambulatory Visit | Attending: Physical Medicine & Rehabilitation | Admitting: Physical Medicine & Rehabilitation

## 2023-09-23 DIAGNOSIS — M4848XA Fatigue fracture of vertebra, sacral and sacrococcygeal region, initial encounter for fracture: Secondary | ICD-10-CM | POA: Diagnosis not present

## 2023-09-23 DIAGNOSIS — R600 Localized edema: Secondary | ICD-10-CM | POA: Diagnosis not present

## 2023-09-23 DIAGNOSIS — M8448XA Pathological fracture, other site, initial encounter for fracture: Secondary | ICD-10-CM

## 2023-10-02 ENCOUNTER — Other Ambulatory Visit: Payer: Self-pay | Admitting: Internal Medicine

## 2023-10-02 DIAGNOSIS — K50819 Crohn's disease of both small and large intestine with unspecified complications: Secondary | ICD-10-CM

## 2023-10-16 ENCOUNTER — Ambulatory Visit: Payer: Self-pay

## 2023-10-16 DIAGNOSIS — K6389 Other specified diseases of intestine: Secondary | ICD-10-CM | POA: Diagnosis not present

## 2023-10-16 DIAGNOSIS — K5289 Other specified noninfective gastroenteritis and colitis: Secondary | ICD-10-CM | POA: Diagnosis not present

## 2023-10-16 DIAGNOSIS — K529 Noninfective gastroenteritis and colitis, unspecified: Secondary | ICD-10-CM | POA: Diagnosis not present

## 2023-10-16 DIAGNOSIS — K5730 Diverticulosis of large intestine without perforation or abscess without bleeding: Secondary | ICD-10-CM | POA: Diagnosis not present

## 2023-10-16 DIAGNOSIS — K6289 Other specified diseases of anus and rectum: Secondary | ICD-10-CM | POA: Diagnosis not present

## 2023-10-16 DIAGNOSIS — K5080 Crohn's disease of both small and large intestine without complications: Secondary | ICD-10-CM | POA: Diagnosis not present

## 2023-10-24 ENCOUNTER — Other Ambulatory Visit: Payer: Self-pay | Admitting: Physical Medicine & Rehabilitation

## 2023-10-24 DIAGNOSIS — M8448XA Pathological fracture, other site, initial encounter for fracture: Secondary | ICD-10-CM

## 2023-10-30 ENCOUNTER — Ambulatory Visit

## 2023-10-31 ENCOUNTER — Inpatient Hospital Stay
Admission: RE | Admit: 2023-10-31 | Discharge: 2023-10-31 | Disposition: A | Discharge status: Home / Self Care | Source: Ambulatory Visit | Attending: Physical Medicine & Rehabilitation | Admitting: Physical Medicine & Rehabilitation

## 2023-10-31 DIAGNOSIS — M8008XA Age-related osteoporosis with current pathological fracture, vertebra(e), initial encounter for fracture: Secondary | ICD-10-CM | POA: Diagnosis not present

## 2023-10-31 DIAGNOSIS — M8448XA Pathological fracture, other site, initial encounter for fracture: Secondary | ICD-10-CM

## 2023-10-31 HISTORY — PX: IR RADIOLOGIST EVAL & MGMT: IMG5224

## 2023-10-31 NOTE — Consult Note (Signed)
 Chief Complaint: Patient was seen in consultation today for sacral insufficiency fractures at the request of Morales,Jennifer I  Referring Physician(s): Morales,Jennifer I  History of Present Illness: Emily Lutz is a 73 y.o. female who was in her usual state of relatively good health and activity until this past May when she had a fall in her yard while doing yard work.  In the following days she developed severe debilitating pain worse on the left than the right in her posterior lower back and radiating into the left lower extremity.  She underwent MRI imaging of both the lumbar spine and sacrum.  She the sacral MRI was performed on 09/23/2023 and confirms bilateral sacral insufficiency fractures.  She has continued with conservative management and initially felt like she was improving before she had a setback and developed recurrent severe pain.  If she tries to work and stand on her feet, her pain is as high as a 9 out of 10 on a 10 point scale.  Her pain is fairly debilitating, she rates a 12 out of 24 on the [employer] disability questionnaire.  She denies lower extremity weakness, paresthesias or other neuropathic symptoms.  Past Medical History:  Diagnosis Date   Asthma    Crohn's disease (HCC)    History of tachycardia    Hypertension    Hypothyroidism    Lactose intolerance     Past Surgical History:  Procedure Laterality Date   BIOPSY  01/10/2023   Procedure: BIOPSY;  Surgeon: Aundria, Ladell POUR, MD;  Location: [facility] ENDOSCOPY;  Service: Gastroenterology;;   BUNIONECTOMY Left    COLON SURGERY     small bowel resection   COLONOSCOPY     6   COLONOSCOPY WITH PROPOFOL  N/A 01/10/2023   Procedure: COLONOSCOPY WITH PROPOFOL ;  Surgeon: Toledo, Ladell POUR, MD;  Location: [facility] ENDOSCOPY;  Service: Gastroenterology;  Laterality: N/A;   IR RADIOLOGIST EVAL & MGMT  10/31/2023   TRIGGER FINGER RELEASE      Allergies: Patient has no known allergies.  Medications: Prior to  Admission medications   Medication Sig Start Date End Date Taking? Authorizing Provider  Cholecalciferol (VITAMIN D3) 250 MCG (10000 UT) capsule Take 10,000 Units by mouth daily.   Yes [provider]  levothyroxine (SYNTHROID) 75 MCG tablet Take 65 mcg by mouth daily before breakfast.   Yes [provider]  metoprolol succinate (TOPROL-XL) 50 MG 24 hr tablet Take 50 mg by mouth daily. Take with or immediately following a meal.   Yes [provider]  Multiple Vitamin (MULTIVITAMIN) tablet Take 1 tablet by mouth daily.   Yes [provider]  predniSONE (STERAPRED UNI-PAK 21 TAB) 5 MG (21) TBPK tablet Take 5 mg by mouth daily. Taper as directed   Yes [provider]  albuterol (ACCUNEB) 0.63 MG/3ML nebulizer solution Take 1 ampule by nebulization every 6 (six) hours as needed for wheezing. Patient not taking: Reported on 10/31/2023    [provider]  ustekinumab MARGARIE) 45 MG/0.5ML injection Inject into the skin. Patient not taking: Reported on 10/31/2023    [provider]     Family History  Problem Relation Age of Onset   Breast cancer Maternal Aunt     Social History   Socioeconomic History   Marital status: Married    Spouse name: Not on file   Number of children: Not on file   Years of education: Not on file   Highest education level: Not on file  Occupational  History   Not on file  Tobacco Use   Smoking status: Some Days    Current packs/day: 0.25    Types: Cigarettes   Smokeless tobacco: Never  Vaping Use   Vaping status: Never Used  Substance and Sexual Activity   Alcohol use: Never   Drug use: Never   Sexual activity: Not on file  Other Topics Concern   Not on file  Social History Narrative   Not on file   Social Drivers of Health   Financial Resource Strain: Low Risk  (01/17/2023)   Received from [facility]   Overall Financial Resource Strain (CARDIA)    Difficulty of Paying  Living Expenses: Not hard at all  Food Insecurity: No Food Insecurity (01/17/2023)   Received from [facility]   Hunger Vital Sign    Within the past 12 months, you worried that your food would run out before you got the money to buy more.: Never true    Within the past 12 months, the food you bought just didn't last and you didn't have money to get more.: Never true  Transportation Needs: No Transportation Needs (01/17/2023)   Received from [facility] - Transportation    In the past 12 months, has lack of transportation kept you from medical appointments or from getting medications?: No    Lack of Transportation (Non-Medical): No  Physical Activity: Not on file  Stress: Not on file  Social Connections: Not on file   Review of Systems: A 12 point ROS discussed and pertinent positives are indicated in the HPI above.  All other systems are negative.  Review of Systems  Vital Signs: BP 137/64 (BP Location: Left Arm, Patient Position: Sitting, Cuff Size: Normal)   Pulse 70   Temp 98 F (36.7 C) (Oral)   Resp 15   Wt 51.3 kg   SpO2 96%   BMI 18.80 kg/m   Advance Care Plan: The advanced care plan/surrogate decision maker was discussed at the time of visit and the patient did not wish to discuss or was not able to name a surrogate decision maker or provide an advance care plan.    Physical Exam Constitutional:      General: She is not in acute distress.    Appearance: Normal appearance. She is normal weight.  HENT:     Head: Normocephalic and atraumatic.  Eyes:     General: No scleral icterus. Cardiovascular:     Rate and Rhythm: Normal rate.  Pulmonary:     Effort: Pulmonary effort is normal.  Abdominal:     General: There is no distension.     Palpations: Abdomen is soft.     Tenderness: There is no abdominal tenderness.  Musculoskeletal:        General: Tenderness present.       Back:     Comments: TTP over bilateral  sacral ala  Skin:    General: Skin is warm and dry.  Neurological:     Mental Status: She is alert and oriented to person, place, and time.  Psychiatric:        Mood and Affect: Mood normal.        Behavior: Behavior normal.       Imaging: IR Radiologist Eval & Mgmt Result Date: 10/31/2023 EXAM: NEW PATIENT OFFICE VISIT CHIEF COMPLAINT: SEE NOTE IN EPIC HISTORY OF PRESENT ILLNESS: SEE NOTE IN EPIC REVIEW OF SYSTEMS: SEE NOTE IN EPIC PHYSICAL  EXAMINATION: SEE NOTE IN EPIC ASSESSMENT AND PLAN: SEE NOTE IN EPIC Electronically Signed   By: Wilkie Lent M.D.   On: 10/31/2023 14:12    Labs:  CBC: No results for input(s): WBC, HGB, HCT, PLT in the last 8760 hours.  COAGS: No results for input(s): INR, APTT in the last 8760 hours.  BMP: No results for input(s): NA, K, CL, CO2, GLUCOSE, BUN, CALCIUM, CREATININE, GFRNONAA, GFRAA in the last 8760 hours.  Invalid input(s): CMP  LIVER FUNCTION TESTS: No results for input(s): BILITOT, AST, ALT, ALKPHOS, PROT, ALBUMIN in the last 8760 hours.  TUMOR MARKERS: No results for input(s): AFPTM, CEA, CA199, CHROMGRNA in the last 8760 hours.  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  She has attempted conservative therapy for the past 2 months but has persistent pain rating as high as a 9 out of 10 on a 10 point scale which is also debilitating for her.  She scored a 12 out of 24 on the [employer] disability questionnaire.  We discussed the risks, benefits and alternatives to pursuing bilateral sacroplasty versus continued conservative management.  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  1.)  Please schedule for outpatient bilateral sacroplasty.  Patient would ideally like to be scheduled on a  Tuesday, specifically Tuesday, 9 September.  Thank you for this interesting consult.  I greatly enjoyed meeting Emily Lutz and look forward to participating in their care.  A copy of this report was sent to the requesting provider on this date.  Electronically Signed: Wilkie MARLA Lent 10/31/2023, 3:20 PM   I spent a total of 40 Minutes  in face to face in clinical consultation, greater than 50% of which was counseling/coordinating care for bilateral sacral fractures.

## 2023-11-10 ENCOUNTER — Other Ambulatory Visit: Payer: Self-pay | Admitting: Interventional Radiology

## 2023-11-10 DIAGNOSIS — M8008XG Age-related osteoporosis with current pathological fracture, vertebra(e), subsequent encounter for fracture with delayed healing: Secondary | ICD-10-CM

## 2023-11-20 ENCOUNTER — Telehealth: Payer: Self-pay

## 2023-11-20 NOTE — Discharge Instructions (Signed)
 Sacroplasty Post Procedure Discharge Instructions  May resume a regular diet and any medications that you routinely take (including pain medications). However, if you are taking Aspirin  or an anticoagulant/blood thinner you will be told when you can resume taking these by the healthcare provider. No driving day of procedure. The day of your procedure take it easy. You may use an ice pack as needed to injection sites on back.  Ice to back 30 minutes on and 30 minutes off, as needed. May remove bandaids tomorrow after taking a shower. Replace daily with a clean bandaid until healed.  Do not lift anything heavier than a milk jug for 1-2 weeks or determined by your physician.  Follow up with your physician in 2 weeks.  If you need to speak to someone after hours, please call the on call IR physician at [phone].  Tell them you are a patient of Dr. Karalee and that you had a Sacroplasty today and the issues you are having.   Please contact our office at [phone] for the following symptoms or if you have any questions:  Fever greater than 100 degrees Increased swelling, pain, or redness at injection site. Increased back and/or leg pain New numbness or change in symptoms from before the procedure.    Thank you for visiting [facility]!

## 2023-11-21 ENCOUNTER — Ambulatory Visit
Admission: RE | Admit: 2023-11-21 | Discharge: 2023-11-21 | Disposition: A | Discharge status: Home / Self Care | Source: Ambulatory Visit | Attending: Interventional Radiology | Admitting: Interventional Radiology

## 2023-11-21 DIAGNOSIS — M8008XG Age-related osteoporosis with current pathological fracture, vertebra(e), subsequent encounter for fracture with delayed healing: Secondary | ICD-10-CM

## 2023-11-21 DIAGNOSIS — M8008XA Age-related osteoporosis with current pathological fracture, vertebra(e), initial encounter for fracture: Secondary | ICD-10-CM | POA: Diagnosis not present

## 2023-11-21 HISTORY — PX: IR VERTEBROPLASTY LUMBAR BX INC UNI/BIL INC/INJECT/IMAGING: IMG5516

## 2023-11-21 MED ORDER — LIDOCAINE-EPINEPHRINE 1 %-1:100000 IJ SOLN
20.0000 mL | Freq: Once | INTRAMUSCULAR | Status: AC
  Administered 2023-11-21: 20 mL via INTRADERMAL

## 2023-11-21 MED ORDER — SODIUM CHLORIDE 0.9 % IV SOLN
INTRAVENOUS | Status: DC
Start: 2023-11-21 — End: 2023-11-22

## 2023-11-21 MED ORDER — CEFAZOLIN SODIUM-DEXTROSE 2-4 GM/100ML-% IV SOLN
2.0000 g | INTRAVENOUS | Status: AC
  Administered 2023-11-21: 2 g via INTRAVENOUS

## 2023-11-21 MED ORDER — MIDAZOLAM HCL 2 MG/2ML IJ SOLN
INTRAMUSCULAR | Status: AC | PRN
  Administered 2023-11-21: 1 mg via INTRAVENOUS

## 2023-11-21 MED ORDER — MIDAZOLAM HCL 2 MG/2ML IJ SOLN: 1.0000 mg | INTRAMUSCULAR | Status: DC | PRN

## 2023-11-21 MED ORDER — FENTANYL CITRATE PF 50 MCG/ML IJ SOSY: 25.0000 ug | PREFILLED_SYRINGE | INTRAMUSCULAR | Status: DC | PRN

## 2023-11-21 MED ORDER — FENTANYL CITRATE (PF) 100 MCG/2ML IJ SOLN
INTRAMUSCULAR | Status: AC | PRN
  Administered 2023-11-21 (×2): 50 ug via INTRAVENOUS
  Administered 2023-11-21 (×2): 25 ug via INTRAVENOUS

## 2023-11-21 MED ORDER — ACETAMINOPHEN 10 MG/ML IV SOLN
1000.0000 mg | Freq: Once | INTRAVENOUS | Status: AC
  Administered 2023-11-21: 1000 mg via INTRAVENOUS

## 2023-11-28 ENCOUNTER — Telehealth: Payer: Self-pay
